# Patient Record
Sex: Male | Born: 1985 | Race: Black or African American | Hispanic: No | Marital: Single | Smoking: Current every day smoker
Health system: Southern US, Community
[De-identification: ages and names within clinical notes are randomized; demographics above are authoritative.]

## PROBLEM LIST (undated history)

## (undated) DIAGNOSIS — F32A Depression, unspecified: Secondary | ICD-10-CM

## (undated) DIAGNOSIS — F329 Major depressive disorder, single episode, unspecified: Secondary | ICD-10-CM

## (undated) DIAGNOSIS — J45909 Unspecified asthma, uncomplicated: Secondary | ICD-10-CM

## (undated) DIAGNOSIS — G4733 Obstructive sleep apnea (adult) (pediatric): Secondary | ICD-10-CM

## (undated) HISTORY — PX: TYMPANOSTOMY TUBE PLACEMENT: SHX32

## (undated) HISTORY — DX: Obstructive sleep apnea (adult) (pediatric): G47.33

## (undated) HISTORY — PX: WISDOM TOOTH EXTRACTION: SHX21

---
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
## 2004-11-10 ENCOUNTER — Ambulatory Visit (HOSPITAL_BASED_OUTPATIENT_CLINIC_OR_DEPARTMENT_OTHER): Admission: RE | Admit: 2004-11-10 | Discharge: 2004-11-10 | Payer: Self-pay | Admitting: Pediatrics

## 2004-11-15 ENCOUNTER — Ambulatory Visit: Payer: Self-pay | Admitting: Internal Medicine

## 2005-01-07 ENCOUNTER — Emergency Department (HOSPITAL_COMMUNITY): Admission: EM | Admit: 2005-01-07 | Discharge: 2005-01-07 | Payer: Self-pay | Admitting: Emergency Medicine

## 2005-09-22 ENCOUNTER — Emergency Department (HOSPITAL_COMMUNITY): Admission: EM | Admit: 2005-09-22 | Discharge: 2005-09-22 | Payer: Self-pay | Admitting: Family Medicine

## 2009-09-03 ENCOUNTER — Emergency Department (HOSPITAL_COMMUNITY): Admission: EM | Admit: 2009-09-03 | Discharge: 2009-09-03 | Payer: Self-pay | Admitting: Emergency Medicine

## 2010-12-11 NOTE — Procedures (Signed)
[name], Leroy Lindsey               ACCOUNT NO.:  [id number]   MEDICAL RECORD NO.:  [id number]          PATIENT TYPE:  OUT   LOCATION:  SLEEP CENTER                 FACILITY:  [facility] ([facility]   PHYSICIAN:  Clinton D. Maple Hudson, M.D. DATE OF BIRTH:  20-Jul-1986   DATE OF STUDY:  11/10/2004                              NOCTURNAL POLYSOMNOGRAM   STUDY DATE:  November 10, 2004   REFERRING PHYSICIAN:  Dr. Maryellen Pile   INDICATION FOR STUDY:  Insomnia with sleep apnea.  Other sleep disturbance  somnambulism.  Epworth Sleepiness Score 14/24, BMI 27, weight 184 pounds.   SLEEP ARCHITECTURE:  Total sleep time 448 minutes with sleep efficiency 87%.  Stage I was 3%, stage II 68%, stages III and IV 10%, REM was 19% of total  sleep time.  Sleep latency 46 minutes, REM latency 68 minutes, awake after  sleep onset 22 minutes, arousal index 4.8.  No sleep medications were taken.  Sleep onset was at 10:08 p.m.   RESPIRATORY DATA:  Respiratory disturbance index (RDI, AHI) 0 per hour.  There were no scoreable episodes of sleep-disordered breathing.   OXYGEN DATA:  Mild snoring noted occasionally.  Oxygen desaturation to a  nadir of 90% with mean oxygen saturation on room air 98%.   CARDIAC DATA:  Normal sinus rhythm and sinus bradycardia, heart rate 52-67  beats per minute.   MOVEMENT/PARASOMNIA:  Occasional leg jerk with insignificant affect on  sleep.   IMPRESSION/RECOMMENDATION:  1.  No sleep-disordered breathing, with only mild snoring and normal      oxygenation.  This is within normal limits.  2.  History suggests sleep schedule disturbance likely to reflect      depression, poor sleep hygiene, and possibly a desire to use sleep to      avoid scheduled daytime activities.  Accuracy and appropriateness of      this evaluation can best be determined by the primary physician.     CDY/MEDQ  D:  11/15/2004 10:40:40  T:  11/15/2004 11:23:19  Job:  [id number]

## 2014-02-20 ENCOUNTER — Emergency Department (HOSPITAL_COMMUNITY)
Admission: EM | Admit: 2014-02-20 | Discharge: 2014-02-20 | Disposition: A | Discharge status: Home / Self Care | Payer: Self-pay | Attending: Emergency Medicine | Admitting: Emergency Medicine

## 2014-02-20 ENCOUNTER — Encounter (HOSPITAL_COMMUNITY): Payer: Self-pay | Admitting: Emergency Medicine

## 2014-02-20 DIAGNOSIS — Z23 Encounter for immunization: Secondary | ICD-10-CM | POA: Insufficient documentation

## 2014-02-20 DIAGNOSIS — S61409A Unspecified open wound of unspecified hand, initial encounter: Secondary | ICD-10-CM | POA: Insufficient documentation

## 2014-02-20 DIAGNOSIS — W261XXA Contact with sword or dagger, initial encounter: Secondary | ICD-10-CM

## 2014-02-20 DIAGNOSIS — Y929 Unspecified place or not applicable: Secondary | ICD-10-CM | POA: Insufficient documentation

## 2014-02-20 DIAGNOSIS — S61412A Laceration without foreign body of left hand, initial encounter: Secondary | ICD-10-CM

## 2014-02-20 DIAGNOSIS — W260XXA Contact with knife, initial encounter: Secondary | ICD-10-CM | POA: Insufficient documentation

## 2014-02-20 DIAGNOSIS — Y9389 Activity, other specified: Secondary | ICD-10-CM | POA: Insufficient documentation

## 2014-02-20 MED ORDER — TETANUS-DIPHTH-ACELL PERTUSSIS 5-2.5-18.5 LF-MCG/0.5 IM SUSP
0.5000 mL | Freq: Once | INTRAMUSCULAR | Status: AC
  Administered 2014-02-20: 0.5 mL via INTRAMUSCULAR
  Filled 2014-02-20: qty 0.5

## 2014-02-20 NOTE — ED Notes (Signed)
Pt reports he cut himself this morning between his left thumb and pointer finger. Pain 5/10 at present. Bleeding controlled.

## 2014-02-20 NOTE — Discharge Instructions (Signed)

## 2014-02-20 NOTE — ED Provider Notes (Signed)
CSN: [id number]     Arrival date & time 02/20/14  1717 History  This chart was scribed for non-physician practitioner, Coral CeoJessica Trinnity Breunig, PA-C working with Leroy RazorStephen Kohut, MD by Phillis HaggisGabriella Gaje, ED scribe. This patient was seen in room WTR6/WTR6 and the patient's care was started at 6:57 PM.   Chief Complaint  Patient presents with  . Extremity Laceration   The history is provided by the patient. No language interpreter was used.   HPI Comments: Leroy Lindsey is a 28 y.o. male with no PMH who presents to the Emergency Department complaining of a laceration between his left thumb and index finger that occurred earlier this morning. He states that he was cutting ice with a knife, chipping away at it when the knife slipped and he cut the area between his left thumb and ring finger. He states that he put peroxide on the area to clean it. He denies the knife breaking at any point and denies any foreign bodies. He reports associated pain and stiffness with movement. No weakness, loss of sensation, numbness/tingling. He states that he is not UTD on his tdap. He reports that it took about 2 hours to control bleeding with direct pressure.    History reviewed. No pertinent past medical history. History reviewed. No pertinent past surgical history. History reviewed. No pertinent family history. History  Substance Use Topics  . Smoking status: Never Smoker   . Smokeless tobacco: Not on file  . Alcohol Use: No    Review of Systems  Constitutional: Negative for fever, chills, activity change, appetite change and fatigue.  Musculoskeletal: Negative for arthralgias, joint swelling and myalgias.  Skin: Positive for wound.  Neurological: Negative for weakness and numbness.  All other systems reviewed and are negative.   Allergies  Review of patient's allergies indicates no known allergies.  Home Medications   Prior to Admission medications   Not on File   BP 128/83  Pulse 80  Temp(Src) 98.1 F (36.7  C) (Oral)  Resp 16  SpO2 99%  Filed Vitals:   02/20/14 1723 02/20/14 1918  BP: 128/83 121/84  Pulse: 80 78  Temp: 98.1 F (36.7 C) 97.7 F (36.5 C)  TempSrc: Oral Oral  Resp: 16 18  SpO2: 99% 98%    Physical Exam  Nursing note and vitals reviewed. Constitutional: He is oriented to person, place, and time. He appears well-developed and well-nourished. No distress.  HENT:  Head: Normocephalic and atraumatic.  Right Ear: External ear normal.  Left Ear: External ear normal.  Nose: Nose normal.  Mouth/Throat: Oropharynx is clear and moist.  Eyes: Conjunctivae and EOM are normal.  Neck: Neck supple.  Cardiovascular: Normal rate.   Radial pulses present on the left. Capillary refill <2 seconds on digits of left hand.   Pulmonary/Chest: Effort normal. No respiratory distress.  Musculoskeletal: Normal range of motion. He exhibits no edema and no tenderness.       Hands: ROM intact in the digits of the left hand without limitations. Grip strength 5/5 on the left   Neurological: He is alert and oriented to person, place, and time.  Gross sensation intact in the left hand  Skin: Skin is warm and dry. He is not diaphoretic.  1 cm linear laceration to the webspace between the 1st and 2nd digits of the left hand. No palpable foreign bodies. Bleeding controlled.   Psychiatric: He has a normal mood and affect. His behavior is normal.    ED Course  Procedures (including critical care  time)  DIAGNOSTIC STUDIES: Oxygen Saturation is 99% on RA, normal by my interpretation.    COORDINATION OF CARE: 6:59 PM-Discussed treatment plan which includes laceration repair and tdap with pt at bedside and pt agreed to plan.   LACERATION REPAIR PROCEDURE NOTE The patient's identification was confirmed and consent was obtained. This procedure was performed by Coral Ceo, PA-C, at 7:01 PM. Site: webbing between left thumb and ring finger Sterile procedures observed Anesthetic used (type and  amt): 6 mL 1% lidocaine without epinephrine Suture type/size: 5.0 Ethilon  Length: 1 cm # of Sutures: 2 Technique: single interrupted.  Complexity: simple Tetanus ordered Site anesthetized, irrigated with NS, explored without evidence of foreign body, wound well approximated, site covered with dry, sterile dressing.  Patient tolerated procedure well without complications. Instructions for care discussed verbally and patient provided with additional written instructions for homecare and f/u.  Labs Review Labs Reviewed - No data to display  Imaging Review No results found.   EKG Interpretation None       MDM   Leroy Lindsey is a 28 y.o. male with no PMH who presents to the Emergency Department complaining of a laceration between his left thumb and index finger that occurred earlier this morning. Laceration repaired in the ED. Patient neurovascularly intact. Tetanus booster given in the ED. Laceration care discussed. Return for suture removal. Return precautions, discharge instructions, and follow-up was discussed with the patient before discharge.     There are no discharge medications for this patient.   Final impressions: 1. Laceration of left hand, initial encounter       Luiz Iron PA-C   I personally performed the services described in this documentation, which was scribed in my presence. The recorded information has been reviewed and is accurate.  Jillyn Ledger, PA-C 02/21/14 [phone]

## 2014-02-22 NOTE — ED Provider Notes (Signed)
Medical screening examination/treatment/procedure(s) were performed by non-physician practitioner and as supervising physician I was immediately available for consultation/collaboration.   EKG Interpretation None       Raeford RazorStephen Napolean Sia, MD 02/22/14 2211

## 2016-05-31 ENCOUNTER — Emergency Department (HOSPITAL_COMMUNITY): Payer: Self-pay

## 2016-05-31 ENCOUNTER — Emergency Department (HOSPITAL_COMMUNITY)
Admission: EM | Admit: 2016-05-31 | Discharge: 2016-05-31 | Disposition: A | Discharge status: Home / Self Care | Payer: Self-pay | Attending: Physician Assistant | Admitting: Physician Assistant

## 2016-05-31 ENCOUNTER — Encounter (HOSPITAL_COMMUNITY): Payer: Self-pay | Admitting: Emergency Medicine

## 2016-05-31 DIAGNOSIS — J40 Bronchitis, not specified as acute or chronic: Secondary | ICD-10-CM | POA: Insufficient documentation

## 2016-05-31 DIAGNOSIS — F17200 Nicotine dependence, unspecified, uncomplicated: Secondary | ICD-10-CM | POA: Insufficient documentation

## 2016-05-31 HISTORY — DX: Unspecified asthma, uncomplicated: J45.909

## 2016-05-31 MED ORDER — PSEUDOEPH-BROMPHEN-DM 15-1-5 MG/5ML PO ELIX: 20.0000 mL | ORAL_SOLUTION | Freq: Four times a day (QID) | ORAL | 0 refills | Status: DC | PRN

## 2016-05-31 NOTE — ED Triage Notes (Signed)
Pt reports productive cough and occasional chills x 4 weeks.Productive cough and decreased breath sounds noteed. No audible wheezing Pt treat red with OTC meds. Pt stated that he has seasonal allergies with a hx of wheezing. Pt is alert, oriented and cooperative.Mother at bedside

## 2016-05-31 NOTE — ED Triage Notes (Signed)
Pt denies sore throat, c/o some itching in throat

## 2016-05-31 NOTE — ED Provider Notes (Signed)
[facility]-EMERGENCY DEPT Provider Note   CSN: [id number] Arrival date & time: 05/31/16  1052  By signing my name below, I, Placido SouLogan Joldersma, attest that this documentation has been prepared under the direction and in the presence of Felicie Mornavid Eleno Weimar, NP. Electronically Signed: Placido SouLogan Joldersma, ED Scribe. 05/31/16. 1:16 PM.   History   Chief Complaint Chief Complaint  Patient presents with  . Cough    x 4 weeks  . Headache    intermittent  . Chills    HPI HPI Comments: Leroy Lindsey is a 30 y.o. male with a h/o asthma who presents to the Emergency Department complaining of constant, moderate, productive cough with white/yellow sputum x 4 weeks. He reports associated rhinorrhea, HA, intermittent post tussive emesis and intermittent chills. He has taken OTC cold medication w/o significant relief. He is a smoker. No nausea, abdominal pain or other associated symptoms at this time.   PCP: None   The history is provided by the patient. No language interpreter was used.    Past Medical History:  Diagnosis Date  . Asthma    seasonal    There are no active problems to display for this patient.   Past Surgical History:  Procedure Laterality Date  . TYMPANOSTOMY TUBE PLACEMENT    . WISDOM TOOTH EXTRACTION         Home Medications    Prior to Admission medications   Not on File    Family History Family History  Problem Relation Age of Onset  . Hypertension Mother     Social History Social History  Substance Use Topics  . Smoking status: Current Some Day Smoker  . Smokeless tobacco: Never Used  . Alcohol use No     Allergies   Patient has no known allergies.   Review of Systems Review of Systems  Constitutional: Positive for chills. Negative for fever.  HENT: Positive for congestion, rhinorrhea and sneezing.   Respiratory: Positive for cough.   Gastrointestinal: Positive for vomiting. Negative for abdominal pain and nausea.  All other systems reviewed and are  negative.  Physical Exam Updated Vital Signs BP 147/97 (BP Location: Left Arm)   Pulse 108   Temp 98.4 F (36.9 C) (Oral)   Resp 22   SpO2 99%   Physical Exam  Constitutional: He is oriented to person, place, and time. He appears well-developed and well-nourished.  HENT:  Head: Normocephalic and atraumatic.  Mouth/Throat: Posterior oropharyngeal erythema present. No oropharyngeal exudate.  Eyes: EOM are normal.  Neck: Normal range of motion.  Cardiovascular: Normal rate.   Pulmonary/Chest: Effort normal. No respiratory distress. He has no wheezes.  Mild rhonchi noted bilaterally. No active wheezing.   Abdominal: Soft.  Musculoskeletal: Normal range of motion.  Neurological: He is alert and oriented to person, place, and time.  Skin: Skin is warm and dry.  Psychiatric: He has a normal mood and affect.  Nursing note and vitals reviewed.  ED Treatments / Results  Labs (all labs ordered are listed, but only abnormal results are displayed) Labs Reviewed - No data to display  EKG  EKG Interpretation None       Radiology Dg Chest 2 View  Result Date: 05/31/2016 CLINICAL DATA:  Mid chest pain and shortness of breath with productive cough for the past 4 weeks. History of asthma, current smoker. EXAM: CHEST  2 VIEW COMPARISON:  PA and lateral chest x-ray of September 03, 2009 FINDINGS: The lungs are reasonably well inflated. The interstitial markings are increased bilaterally. There  is no alveolar infiltrate. There is no pleural effusion. The heart and pulmonary vascularity are normal. The mediastinum is normal in width. The bony thorax exhibits no acute abnormality. IMPRESSION: Interstitial prominence bilaterally which is more conspicuous than on the previous study. This is consistent with known reactive airway disease but there may be superimposed acute bronchitis. Electronically Signed   By: Marcianne Ozbun  [place] M.D.   On: 05/31/2016 12:59    Procedures Procedures  DIAGNOSTIC  STUDIES: Oxygen Saturation is 99% on RA, normal by my interpretation.    COORDINATION OF CARE: 1:16 PM Discussed next steps with pt. Pt verbalized understanding and is agreeable with the plan.    Medications Ordered in ED Medications - No data to display   Initial Impression / Assessment and Plan / ED Course  I have reviewed the triage vital signs and the nursing notes.  Pertinent labs & imaging results that were available during my care of the patient were reviewed by me and considered in my medical decision making (see chart for details).  Clinical Course     Patient with mild signs and symptoms of asthma/RAD. Oxygen saturation is above 90%. No accessory muscle use, no cyanosis. No wheezing. Will discharge with anti-tussive/anti-histamine. Pt instructed to follow up with PCP. Patient is hemodynamically stable. Discussed return precautions. Pt appears safe for discharge.     Final Clinical Impressions(s) / ED Diagnoses   Final diagnoses:  Bronchitis    New Prescriptions Discharge Medication List as of 05/31/2016  1:46 PM    START taking these medications   Details  brompheniramine-pseudoephedrine-dextromethorphan (DIMETAPP DM) 15-1-5 MG/5ML ELIX Take 20 mLs by mouth every 6 (six) hours as needed., Starting Mon 05/31/2016, Print         Felicie Mornavid Joann Jorge, NP 05/31/16 1459    Courteney Lyn Mackuen, MD 06/02/16 0900

## 2016-05-31 NOTE — ED Notes (Signed)
Bed: WTR6 Expected date:  Expected time:  Means of arrival:  Comments: 

## 2017-02-25 ENCOUNTER — Emergency Department (HOSPITAL_COMMUNITY)
Admission: EM | Admit: 2017-02-25 | Discharge: 2017-02-25 | Disposition: A | Discharge status: Home / Self Care | Payer: Self-pay | Attending: Emergency Medicine | Admitting: Emergency Medicine

## 2017-02-25 ENCOUNTER — Emergency Department (HOSPITAL_COMMUNITY): Payer: Self-pay

## 2017-02-25 ENCOUNTER — Encounter (HOSPITAL_COMMUNITY): Payer: Self-pay

## 2017-02-25 DIAGNOSIS — J45909 Unspecified asthma, uncomplicated: Secondary | ICD-10-CM | POA: Insufficient documentation

## 2017-02-25 DIAGNOSIS — Y939 Activity, unspecified: Secondary | ICD-10-CM | POA: Insufficient documentation

## 2017-02-25 DIAGNOSIS — S52501A Unspecified fracture of the lower end of right radius, initial encounter for closed fracture: Secondary | ICD-10-CM | POA: Insufficient documentation

## 2017-02-25 DIAGNOSIS — W010XXA Fall on same level from slipping, tripping and stumbling without subsequent striking against object, initial encounter: Secondary | ICD-10-CM | POA: Insufficient documentation

## 2017-02-25 DIAGNOSIS — F17210 Nicotine dependence, cigarettes, uncomplicated: Secondary | ICD-10-CM | POA: Insufficient documentation

## 2017-02-25 DIAGNOSIS — Y999 Unspecified external cause status: Secondary | ICD-10-CM | POA: Insufficient documentation

## 2017-02-25 DIAGNOSIS — Y929 Unspecified place or not applicable: Secondary | ICD-10-CM | POA: Insufficient documentation

## 2017-02-25 MED ORDER — IBUPROFEN 600 MG PO TABS: 600.0000 mg | ORAL_TABLET | Freq: Four times a day (QID) | ORAL | 0 refills | Status: DC | PRN

## 2017-02-25 NOTE — ED Provider Notes (Signed)
[facility]-EMERG[facility]Y DEPT Provider Note   CSN: [id number] Arrival date & time: 02/25/17  1040     History   Chief Complaint Chief Complaint  Patient presents with  . Wrist Pain  . Fall    HPI Leroy LeiterSteven Barrasso is a 31 y.o. male.  The history is provided by the patient and medical records. No language interpreter was used.  Wrist Pain   Fall    Leroy Lindsey is a 31 y.o. male  with a PMH of asthma who presents to the Emergency Department complaining of Acute onset of right wrist pain which began after falling on outstretched hand yesterday afternoon. Patient states that he slipped on the wet ground, falling forward catching himself with the right hand. Denies any other injuries from the fall. No head injury or loss of consciousness. Not on anticoagulation. Has been applying ice with moderate relief. Associated symptoms include swelling. No numbness or tingling. No weakness. No history of prior injuries to the right upper extremity.  Past Medical History:  Diagnosis Date  . Asthma    seasonal    There are no active problems to display for this patient.   Past Surgical History:  Procedure Laterality Date  . TYMPANOSTOMY TUBE PLACEMENT    . WISDOM TOOTH EXTRACTION         Home Medications    Prior to Admission medications   Medication Sig Start Date End Date Taking? Authorizing Provider  brompheniramine-pseudoephedrine-dextromethorphan (DIMETAPP DM) 15-1-5 MG/5ML ELIX Take 20 mLs by mouth every 6 (six) hours as needed. 05/31/16   Felicie MornSmith, David, NP  ibuprofen (ADVIL,MOTRIN) 600 MG tablet Take 1 tablet (600 mg total) by mouth every 6 (six) hours as needed. 02/25/17   Ward, Chase PicketJaime Pilcher, PA-C    Family History Family History  Problem Relation Age of Onset  . Hypertension Mother     Social History Social History  Substance Use Topics  . Smoking status: Current Some Day Smoker    Types: Cigarettes  . Smokeless tobacco: Never Used  . Alcohol use No     Allergies     Patient has no known allergies.   Review of Systems Review of Systems  Musculoskeletal: Positive for arthralgias.  Neurological: Negative for weakness and numbness.     Physical Exam Updated Vital Signs BP (!) 144/96 (BP Location: Left Arm)   Pulse 88   Temp 98 F (36.7 C)   Resp 16   Ht 5\' 10"  (1.778 m)   Wt 105.2 kg (232 lb)   SpO2 95%   BMI 33.29 kg/m   Physical Exam  Constitutional: He appears well-developed and well-nourished. No distress.  HENT:  Head: Normocephalic and atraumatic.  Neck: Neck supple.  Cardiovascular: Normal rate, regular rhythm and normal heart sounds.   No murmur heard. Pulmonary/Chest: Effort normal and breath sounds normal. No respiratory distress. He has no wheezes. He has no rales.  Musculoskeletal:  Tenderness to palpation of radial aspect of the wrist associated with swelling. Good grip strength. All compartments soft. Sensation intact to radial, ulnar and median nerve distributions. Good cap refill in all fingers.  Neurological: He is alert.  Skin: Skin is warm and dry.  Nursing note and vitals reviewed.    ED Treatments / Results  Labs (all labs ordered are listed, but only abnormal results are displayed) Labs Reviewed - No data to display  EKG  EKG Interpretation None       Radiology Dg Wrist Complete Right  Result Date: 02/25/2017 CLINICAL DATA:  Pain following fall EXAM: RIGHT WRIST - COMPLETE 3+ VIEW COMPARISON:  None. FINDINGS: Frontal, oblique, lateral, and ulnar deviation scaphoid images were obtained. There is a rather subtle nondisplaced fracture of the distal radial metaphysis, best appreciated on the lateral view. No other evident fracture. No dislocation. Joint spaces appear normal. No erosive change. IMPRESSION: Subtle nondisplaced fracture distal radial metaphysis, best appreciated on the lateral view. No other fracture. No dislocation. No appreciable arthropathy. Electronically Signed   By: Bretta BangWilliam  Woodruff III  M.D.   On: 02/25/2017 11:44    Procedures Procedures (including critical care time)  SPLINT APPLICATION Date/Time: 1:24 PM Authorized by: Chase PicketJaime Pilcher Ward Consent: Verbal consent obtained. Risks and benefits: risks, benefits and alternatives were discussed Consent given by: patient Splint applied by: orthopedic technician Location details: Right upper extremity Splint type: Sugar tong Post-procedure: The splinted body part was neurovascularly unchanged following the procedure. Patient tolerance: Patient tolerated the procedure well with no immediate complications.    Medications Ordered in ED Medications - No data to display   Initial Impression / Assessment and Plan / ED Course  I have reviewed the triage vital signs and the nursing notes.  Pertinent labs & imaging results that were available during my care of the patient were reviewed by me and considered in my medical decision making (see chart for details).     Leroy LeiterSteven Osgood is a 31 y.o. male who presents to ED for right wrist pain s/p mechanical fall. NVI. Tenderness and swelling on exam. X-ray shows subtle nondisplaced distal radial fracture. Splint placed. Follow up with ortho. Home care and return precautions discussed. All questions answered.    Final Clinical Impressions(s) / ED Diagnoses   Final diagnoses:  Closed fracture of distal end of right radius, unspecified fracture morphology, initial encounter    New Prescriptions New Prescriptions   IBUPROFEN (ADVIL,MOTRIN) 600 MG TABLET    Take 1 tablet (600 mg total) by mouth every 6 (six) hours as needed.     Ward, Chase PicketJaime Pilcher, PA-C 02/25/17 1324    Derwood KaplanNanavati, Ankit, MD 02/25/17 1757

## 2017-02-25 NOTE — ED Notes (Signed)
PATIENT FELL 02/24/17  NOW HAS RIGHT FOREARM PAIN  HAS GOOD PMS  SOME SWELLING PATIENT NEGATIVE FOR DEFORMITIES

## 2017-02-25 NOTE — ED Notes (Signed)
Bed: WA05 Expected date:  Expected time:  Means of arrival:  Comments: 

## 2017-02-25 NOTE — ED Notes (Signed)
Ortho tech notified of splint need. 

## 2017-02-25 NOTE — ED Notes (Signed)
Ortho tech busy assisting another pt, PA and Pt made aware of the delay

## 2017-02-25 NOTE — ED Triage Notes (Addendum)
Patient states he slipped on the wet ground this AM and landed on his right hand. Patient c/o pain of the right wrist. Patient denies hitting his head. No deformity noted.

## 2017-02-25 NOTE — Discharge Instructions (Signed)
Ibuprofen as needed for pain. Ice affected area (see instructions below).  Please call the orthopedic physician listed today or first thing in the morning to schedule a follow up appointment.   Fractures generally take 4-6 weeks to heal.  It is very important to keep your splint dry until your follow up with the orthopedic doctor and a cast can be applied. You may place a plastic bag around the extremity with the splint while bathing to keep it dry. Also try to sleep with the extremity elevated for the next several nights to decrease swelling. Check the fingertips several times per day to make sure they are not cold, pale, or blue. If this is the case, the splint may be too tight and should return to the ER, your regular doctor or the orthopedist for recheck. Return to the ER for new or worsening symptoms, any additional concerns.   COLD THERAPY DIRECTIONS:  Ice or gel packs can be used to reduce both pain and swelling. Ice is the most helpful within the first 24 to 48 hours after an injury or flareup from overusing a muscle or joint.  Ice is effective, has very few side effects, and is safe for most people to use.   If you expose your skin to cold temperatures for too long or without the proper protection, you can damage your skin or nerves. Watch for signs of skin damage due to cold.   HOME CARE INSTRUCTIONS  Follow these tips to use ice and cold packs safely.  Place a dry or damp towel between the ice and skin. A damp towel will cool the skin more quickly, so you may need to shorten the time that the ice is used.  For a more rapid response, add gentle compression to the ice.  Ice for no more than 10 to 20 minutes at a time. The bonier the area you are icing, the less time it will take to get the benefits of ice.  Check your skin after 5 minutes to make sure there are no signs of a poor response to cold or skin damage.  Rest 20 minutes or more in between uses.  Once your skin is numb, you can end  your treatment. You can test numbness by very lightly touching your skin. The touch should be so light that you do not see the skin dimple from the pressure of your fingertip. When using ice, most people will feel these normal sensations in this order: cold, burning, aching, and numbness.

## 2018-04-04 ENCOUNTER — Emergency Department (HOSPITAL_COMMUNITY): Payer: Self-pay

## 2018-04-04 ENCOUNTER — Observation Stay (HOSPITAL_COMMUNITY)
Admission: EM | Admit: 2018-04-04 | Discharge: 2018-04-05 | Disposition: A | Discharge status: Home / Self Care | Payer: Self-pay | Attending: Internal Medicine | Admitting: Internal Medicine

## 2018-04-04 ENCOUNTER — Encounter (HOSPITAL_COMMUNITY): Payer: Self-pay | Admitting: Emergency Medicine

## 2018-04-04 ENCOUNTER — Other Ambulatory Visit: Payer: Self-pay

## 2018-04-04 DIAGNOSIS — F17210 Nicotine dependence, cigarettes, uncomplicated: Secondary | ICD-10-CM | POA: Insufficient documentation

## 2018-04-04 DIAGNOSIS — F329 Major depressive disorder, single episode, unspecified: Secondary | ICD-10-CM | POA: Insufficient documentation

## 2018-04-04 DIAGNOSIS — E872 Acidosis: Secondary | ICD-10-CM | POA: Insufficient documentation

## 2018-04-04 DIAGNOSIS — Z79899 Other long term (current) drug therapy: Secondary | ICD-10-CM | POA: Insufficient documentation

## 2018-04-04 DIAGNOSIS — M6282 Rhabdomyolysis: Secondary | ICD-10-CM | POA: Insufficient documentation

## 2018-04-04 DIAGNOSIS — E860 Dehydration: Secondary | ICD-10-CM | POA: Insufficient documentation

## 2018-04-04 DIAGNOSIS — N179 Acute kidney failure, unspecified: Principal | ICD-10-CM | POA: Insufficient documentation

## 2018-04-04 DIAGNOSIS — Z720 Tobacco use: Secondary | ICD-10-CM | POA: Diagnosis present

## 2018-04-04 DIAGNOSIS — D72829 Elevated white blood cell count, unspecified: Secondary | ICD-10-CM | POA: Diagnosis present

## 2018-04-04 DIAGNOSIS — E876 Hypokalemia: Secondary | ICD-10-CM | POA: Insufficient documentation

## 2018-04-04 DIAGNOSIS — E669 Obesity, unspecified: Secondary | ICD-10-CM | POA: Insufficient documentation

## 2018-04-04 DIAGNOSIS — J45909 Unspecified asthma, uncomplicated: Secondary | ICD-10-CM | POA: Insufficient documentation

## 2018-04-04 DIAGNOSIS — Z6839 Body mass index (BMI) 39.0-39.9, adult: Secondary | ICD-10-CM | POA: Insufficient documentation

## 2018-04-04 DIAGNOSIS — E8729 Other acidosis: Secondary | ICD-10-CM | POA: Diagnosis present

## 2018-04-04 DIAGNOSIS — R7401 Elevation of levels of liver transaminase levels: Secondary | ICD-10-CM | POA: Diagnosis present

## 2018-04-04 DIAGNOSIS — R740 Nonspecific elevation of levels of transaminase and lactic acid dehydrogenase [LDH]: Secondary | ICD-10-CM | POA: Insufficient documentation

## 2018-04-04 HISTORY — DX: Depression, unspecified: F32.A

## 2018-04-04 HISTORY — DX: Major depressive disorder, single episode, unspecified: F32.9

## 2018-04-04 LAB — CBC WITH DIFFERENTIAL/PLATELET
Basophils Absolute: 0 10*3/uL (ref 0.0–0.1)
Basophils Relative: 0 %
Eosinophils Absolute: 0 10*3/uL (ref 0.0–0.7)
Eosinophils Relative: 0 %
HEMATOCRIT: 43.8 % (ref 39.0–52.0)
HEMOGLOBIN: 14.9 g/dL (ref 13.0–17.0)
LYMPHS ABS: 2.1 10*3/uL (ref 0.7–4.0)
Lymphocytes Relative: 19 %
MCH: 29.7 pg (ref 26.0–34.0)
MCHC: 34 g/dL (ref 30.0–36.0)
MCV: 87.3 fL (ref 78.0–100.0)
MONOS PCT: 4 %
Monocytes Absolute: 0.5 10*3/uL (ref 0.1–1.0)
NEUTROS ABS: 8.4 10*3/uL — AB (ref 1.7–7.7)
NEUTROS PCT: 77 %
Platelets: 252 10*3/uL (ref 150–400)
RBC: 5.02 MIL/uL (ref 4.22–5.81)
RDW: 14 % (ref 11.5–15.5)
WBC: 11 10*3/uL — ABNORMAL HIGH (ref 4.0–10.5)

## 2018-04-04 LAB — COMPREHENSIVE METABOLIC PANEL
ALBUMIN: 5.3 g/dL — AB (ref 3.5–5.0)
ALK PHOS: 75 U/L (ref 38–126)
ALT: 47 U/L — ABNORMAL HIGH (ref 0–44)
ANION GAP: 20 — AB (ref 5–15)
AST: 51 U/L — ABNORMAL HIGH (ref 15–41)
BILIRUBIN TOTAL: 1.2 mg/dL (ref 0.3–1.2)
BUN: 24 mg/dL — ABNORMAL HIGH (ref 6–20)
CALCIUM: 10.1 mg/dL (ref 8.9–10.3)
CO2: 18 mmol/L — AB (ref 22–32)
Chloride: 101 mmol/L (ref 98–111)
Creatinine, Ser: 2.04 mg/dL — ABNORMAL HIGH (ref 0.61–1.24)
GFR calc non Af Amer: 41 mL/min — ABNORMAL LOW (ref >60)
GFR, EST AFRICAN AMERICAN: 48 mL/min — AB (ref >60)
GLUCOSE: 120 mg/dL — AB (ref 70–99)
Potassium: 3.4 mmol/L — ABNORMAL LOW (ref 3.5–5.1)
Sodium: 139 mmol/L (ref 135–145)
Total Protein: 9.2 g/dL — ABNORMAL HIGH (ref 6.5–8.1)

## 2018-04-04 LAB — I-STAT TROPONIN, ED: Troponin i, poc: 0.02 ng/mL (ref 0.00–0.08)

## 2018-04-04 LAB — CK: Total CK: 937 U/L — ABNORMAL HIGH (ref 49–397)

## 2018-04-04 MED ORDER — SODIUM CHLORIDE 0.9 % IV BOLUS
1000.0000 mL | Freq: Once | INTRAVENOUS | Status: AC
Start: 2018-04-04 — End: 2018-04-04
  Administered 2018-04-04: 1000 mL via INTRAVENOUS

## 2018-04-04 MED ORDER — SODIUM CHLORIDE 0.9 % IV BOLUS
1000.0000 mL | Freq: Once | INTRAVENOUS | Status: AC
  Administered 2018-04-04: 1000 mL via INTRAVENOUS

## 2018-04-04 NOTE — ED Provider Notes (Signed)
[facility]-EMERGENCY DEPT Provider Note   CSN: [id number] Arrival date & time: 04/04/18  2046  History   Chief Complaint Chief Complaint  Patient presents with  . Dehydration    HPI Leroy Lindsey is a 32 y.o. male who presents for evaluation of muscle cramping. States he has been outside moving furniture the past two days and noticed lower back cramping and spasms yesterday evening. He awoke today and stated that he began having lower leg cramping and intermittent cramping in his lower chest. Denies aggravating or alleviating factors. Rates his cramping a 7/10. Denies radiation of pain. Denies headache, blurred vision, SOB, syncope, abdominal pain, nausea, vomiting, diarrhea, constipation. States he took a muscle relaxer yesterday evening which "help some" with the pain.  HPI  Past Medical History:  Diagnosis Date  . Asthma    seasonal    There are no active problems to display for this patient.   Past Surgical History:  Procedure Laterality Date  . TYMPANOSTOMY TUBE PLACEMENT    . WISDOM TOOTH EXTRACTION          Home Medications    Prior to Admission medications   Medication Sig Start Date End Date Taking? Authorizing Provider  escitalopram (LEXAPRO) 10 MG tablet Take 10 mg by mouth daily.   Yes [provider]  methocarbamol (ROBAXIN) 750 MG tablet Take 750 mg by mouth daily as needed for muscle spasms.   Yes [provider]  brompheniramine-pseudoephedrine-dextromethorphan (DIMETAPP DM) 15-1-5 MG/5ML ELIX Take 20 mLs by mouth every 6 (six) hours as needed. Patient not taking: Reported on 04/04/2018 05/31/16   Felicie Morn, NP  ibuprofen (ADVIL,MOTRIN) 600 MG tablet Take 1 tablet (600 mg total) by mouth every 6 (six) hours as needed. Patient not taking: Reported on 04/04/2018 02/25/17   Ward, Chase Picket, PA-C    Family History Family History  Problem Relation Age of Onset  . Hypertension Mother     Social History Social  History   Tobacco Use  . Smoking status: Current Some Day Smoker    Types: Cigarettes  . Smokeless tobacco: Never Used  Substance Use Topics  . Alcohol use: No  . Drug use: No     Allergies   Patient has no known allergies.   Review of Systems Review of Systems  Review of systems negative unless otherwise stated in the HPI. Physical Exam Updated Vital Signs BP (!) 155/92 (BP Location: Right Arm)   Pulse 78   Temp 97.6 F (36.4 C) (Oral)   Resp (!) 21   Ht 5\' 8"  (1.727 m)   Wt 117.9 kg   SpO2 100%   BMI 39.53 kg/m   Physical Exam  Constitutional: He appears well-developed and well-nourished. No distress.  HENT:  Head: Normocephalic and atraumatic.  Mouth/Throat: Oropharynx is clear and moist.  Eyes: Pupils are equal, round, and reactive to light.  Neck: Normal range of motion. Neck supple.  Cardiovascular: Normal rate, regular rhythm, normal heart sounds, intact distal pulses and normal pulses. Exam reveals no friction rub.  No murmur heard. Pulmonary/Chest: Effort normal and breath sounds normal. No stridor. No respiratory distress. He has no wheezes. He has no rales. He exhibits no mass, no tenderness, no laceration, no crepitus, no edema, no deformity, no swelling and no retraction.  Abdominal: Soft. Bowel sounds are normal. He exhibits no distension and no mass. There is no tenderness. There is no rebound and no guarding. No hernia.  Musculoskeletal: Normal range of motion. He exhibits  no edema, tenderness or deformity.  No midline tenderness to palpation.  Full range of motion to bilateral extremities. 5/5 strength.  Neurological: He is alert. No sensory deficit.  Skin: Skin is warm and dry. No rash noted. He is not diaphoretic. No erythema. No pallor.  Psychiatric: He has a normal mood and affect.  Nursing note and vitals reviewed.    ED Treatments / Results  Labs (all labs ordered are listed, but only abnormal results are displayed) Labs Reviewed  CBC  WITH DIFFERENTIAL/PLATELET - Abnormal; Notable for the following components:      Result Value   WBC 11.0 (*)    Neutro Abs 8.4 (*)    All other components within normal limits  COMPREHENSIVE METABOLIC PANEL - Abnormal; Notable for the following components:   Potassium 3.4 (*)    CO2 18 (*)    Glucose, Bld 120 (*)    BUN 24 (*)    Creatinine, Ser 2.04 (*)    Total Protein 9.2 (*)    Albumin 5.3 (*)    AST 51 (*)    ALT 47 (*)    GFR calc non Af Amer 41 (*)    GFR calc Af Amer 48 (*)    Anion gap 20 (*)    All other components within normal limits  CK - Abnormal; Notable for the following components:   Total CK 937 (*)    All other components within normal limits  URINALYSIS, ROUTINE W REFLEX MICROSCOPIC  I-STAT TROPONIN, ED    EKG None  Radiology Dg Chest 2 View  Result Date: 04/04/2018 CLINICAL DATA:  Chest pain. EXAM: CHEST - 2 VIEW COMPARISON:  05/31/2016 FINDINGS: The cardiomediastinal contours are normal. The lungs are clear. Pulmonary vasculature is normal. No consolidation, pleural effusion, or pneumothorax. No acute osseous abnormalities are seen. IMPRESSION: Negative radiographs of the chest. Electronically Signed   By: Narda Rutherford M.D.   On: 04/04/2018 22:07    Procedures Procedures (including critical care time)  Medications Ordered in ED Medications  sodium chloride 0.9 % bolus 1,000 mL (1,000 mLs Intravenous New Bag/Given 04/04/18 2341)  sodium chloride 0.9 % bolus 1,000 mL (0 mLs Intravenous Stopped 04/04/18 2340)     Initial Impression / Assessment and Plan / ED Course  I have reviewed the triage vital signs and the nursing notes as well as past medical history.  Pertinent labs & imaging results that were available during my care of the patient were reviewed by me and considered in my medical decision making (see chart for details).  Patient presents for evaluation of muscle cramps and spasms. Hx of strenuous work outside the last 2 days. Concern  for acute heat injury. Will obtain labs, urine, EKG and start fluids and reevaluate.  CK 937, WBC with leukocytosis at 11.0, creatinine 2.04. Urine pending. Will consult with hospitalists for admission for AKI.   0005: Dr. Katrinka Blazing with hospitalists agree for admission.   Final Clinical Impressions(s) / ED Diagnoses   Final diagnoses:  AKI (acute kidney injury) [facility])    ED Discharge Orders    None       Keliyah Spillman A, PA-C 04/05/18 0014    Bethann Berkshire, MD 04/05/18 1429

## 2018-04-04 NOTE — ED Notes (Signed)
Bed: LG92 Expected date:  Expected time:  Means of arrival:  Comments: Ems, cramping dehydrated

## 2018-04-04 NOTE — ED Triage Notes (Signed)
Pt arriving via [facility] with dehydration and cramping. Pt reports he has been outside all day moving furniture. Pt diaphoretic upon arrival.

## 2018-04-05 ENCOUNTER — Encounter (HOSPITAL_COMMUNITY): Payer: Self-pay | Admitting: Internal Medicine

## 2018-04-05 DIAGNOSIS — D72829 Elevated white blood cell count, unspecified: Secondary | ICD-10-CM | POA: Diagnosis present

## 2018-04-05 DIAGNOSIS — N179 Acute kidney failure, unspecified: Principal | ICD-10-CM

## 2018-04-05 DIAGNOSIS — R7401 Elevation of levels of liver transaminase levels: Secondary | ICD-10-CM | POA: Diagnosis present

## 2018-04-05 DIAGNOSIS — E8729 Other acidosis: Secondary | ICD-10-CM | POA: Diagnosis present

## 2018-04-05 DIAGNOSIS — E876 Hypokalemia: Secondary | ICD-10-CM

## 2018-04-05 DIAGNOSIS — R740 Nonspecific elevation of levels of transaminase and lactic acid dehydrogenase [LDH]: Secondary | ICD-10-CM

## 2018-04-05 DIAGNOSIS — Z720 Tobacco use: Secondary | ICD-10-CM

## 2018-04-05 DIAGNOSIS — E860 Dehydration: Secondary | ICD-10-CM

## 2018-04-05 DIAGNOSIS — E872 Acidosis: Secondary | ICD-10-CM

## 2018-04-05 DIAGNOSIS — E6609 Other obesity due to excess calories: Secondary | ICD-10-CM

## 2018-04-05 DIAGNOSIS — E669 Obesity, unspecified: Secondary | ICD-10-CM | POA: Diagnosis present

## 2018-04-05 DIAGNOSIS — Z6839 Body mass index (BMI) 39.0-39.9, adult: Secondary | ICD-10-CM

## 2018-04-05 LAB — CBC
HCT: 39.2 % (ref 39.0–52.0)
Hemoglobin: 13.2 g/dL (ref 13.0–17.0)
MCH: 29.4 pg (ref 26.0–34.0)
MCHC: 33.7 g/dL (ref 30.0–36.0)
MCV: 87.3 fL (ref 78.0–100.0)
PLATELETS: 244 10*3/uL (ref 150–400)
RBC: 4.49 MIL/uL (ref 4.22–5.81)
RDW: 14.3 % (ref 11.5–15.5)
WBC: 14.3 10*3/uL — ABNORMAL HIGH (ref 4.0–10.5)

## 2018-04-05 LAB — COMPREHENSIVE METABOLIC PANEL
ALT: 38 U/L (ref 0–44)
ANION GAP: 10 (ref 5–15)
AST: 43 U/L — ABNORMAL HIGH (ref 15–41)
Albumin: 4.3 g/dL (ref 3.5–5.0)
Alkaline Phosphatase: 67 U/L (ref 38–126)
BUN: 16 mg/dL (ref 6–20)
CO2: 20 mmol/L — AB (ref 22–32)
Calcium: 8.8 mg/dL — ABNORMAL LOW (ref 8.9–10.3)
Chloride: 108 mmol/L (ref 98–111)
Creatinine, Ser: 0.88 mg/dL (ref 0.61–1.24)
Glucose, Bld: 105 mg/dL — ABNORMAL HIGH (ref 70–99)
POTASSIUM: 4.3 mmol/L (ref 3.5–5.1)
SODIUM: 138 mmol/L (ref 135–145)
Total Bilirubin: 0.9 mg/dL (ref 0.3–1.2)
Total Protein: 7.7 g/dL (ref 6.5–8.1)

## 2018-04-05 LAB — PHOSPHORUS: PHOSPHORUS: 3.9 mg/dL (ref 2.5–4.6)

## 2018-04-05 LAB — URINALYSIS, ROUTINE W REFLEX MICROSCOPIC
BACTERIA UA: NONE SEEN
BILIRUBIN URINE: NEGATIVE
Glucose, UA: NEGATIVE mg/dL
Ketones, ur: 80 mg/dL — AB
Leukocytes, UA: NEGATIVE
Nitrite: NEGATIVE
PROTEIN: 30 mg/dL — AB
SPECIFIC GRAVITY, URINE: 1.018 (ref 1.005–1.030)
pH: 5 (ref 5.0–8.0)

## 2018-04-05 LAB — HIV ANTIBODY (ROUTINE TESTING W REFLEX): HIV SCREEN 4TH GENERATION: NONREACTIVE

## 2018-04-05 LAB — MAGNESIUM: MAGNESIUM: 2.3 mg/dL (ref 1.7–2.4)

## 2018-04-05 LAB — CK: CK TOTAL: 1697 U/L — AB (ref 49–397)

## 2018-04-05 MED ORDER — ENOXAPARIN SODIUM 40 MG/0.4ML ~~LOC~~ SOLN
40.0000 mg | Freq: Every day | SUBCUTANEOUS | Status: DC
  Administered 2018-04-05: 40 mg via SUBCUTANEOUS
  Filled 2018-04-05: qty 0.4

## 2018-04-05 MED ORDER — ACETAMINOPHEN 650 MG RE SUPP: 650.0000 mg | Freq: Four times a day (QID) | RECTAL | Status: DC | PRN

## 2018-04-05 MED ORDER — ONDANSETRON HCL 4 MG PO TABS: 4.0000 mg | ORAL_TABLET | Freq: Four times a day (QID) | ORAL | Status: DC | PRN

## 2018-04-05 MED ORDER — ONDANSETRON HCL 4 MG/2ML IJ SOLN: 4.0000 mg | Freq: Four times a day (QID) | INTRAMUSCULAR | Status: DC | PRN

## 2018-04-05 MED ORDER — SODIUM CHLORIDE 0.9 % IV SOLN
INTRAVENOUS | Status: DC
  Administered 2018-04-05: 01:00:00 via INTRAVENOUS

## 2018-04-05 MED ORDER — ACETAMINOPHEN 325 MG PO TABS: 650.0000 mg | ORAL_TABLET | Freq: Four times a day (QID) | ORAL | Status: DC | PRN

## 2018-04-05 MED ORDER — ALBUTEROL SULFATE (2.5 MG/3ML) 0.083% IN NEBU: 2.5000 mg | INHALATION_SOLUTION | Freq: Four times a day (QID) | RESPIRATORY_TRACT | Status: DC | PRN

## 2018-04-05 MED ORDER — ESCITALOPRAM OXALATE 10 MG PO TABS
10.0000 mg | ORAL_TABLET | Freq: Every day | ORAL | Status: DC
  Administered 2018-04-05: 10 mg via ORAL
  Filled 2018-04-05: qty 1

## 2018-04-05 MED ORDER — POTASSIUM CHLORIDE CRYS ER 20 MEQ PO TBCR
40.0000 meq | EXTENDED_RELEASE_TABLET | ORAL | Status: AC
  Administered 2018-04-05: 40 meq via ORAL
  Filled 2018-04-05: qty 2

## 2018-04-05 MED ORDER — METHOCARBAMOL 500 MG PO TABS: 750.0000 mg | ORAL_TABLET | Freq: Every day | ORAL | Status: DC | PRN

## 2018-04-05 NOTE — Discharge Summary (Addendum)
Physician Discharge Summary  Leroy Lindsey HEN:[id number] DOB: 1985-11-10 DOA: 04/04/2018  PCP: Patient, No Pcp Per  Admit date: 04/04/2018 Discharge date: 04/05/2018  Admitted From: home Disposition:  home Recommendations for Outpatient Follow-up:  1. Follow up with community health and wellness  in 1-2 weeks 2. Please obtain CMP/CBC /CPK in one week  Home Health none Equipment/Devices:none  Discharge Condition stable CODE STATUS:stable Diet cardiac  Brief/Interim Summary:32 y.o. male with medical history significant of obesity, depression, and tobacco use; who presents with complaints of muscle cramps.  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  Patient reported trying to keep his self hydrated.  He had began having cramps of his legs, lower chest, and back that he describes as severe.  He had tried taking a muscle relaxer with some temporary relief.  Associated symptoms included generalized malaise, diaphoresis, some difficulty catching his breath, nausea, and vomiting.  Emesis was noted to be nonbloody in appearance.  Denies having any headache, change in vision, focal weakness, loss of consciousness, abdominal pain, dysuria, diarrhea, suicidal ideation, or illicit drug use.  He had been started on Lexapro in July for depression with improvement of symptoms.  Denies having any side effects from taking Lexapro.   ED Course: On admission into the emergency department patient was seen to be afebrile, respiration 21-39, blood pressures 148/99-158/95, and O2 saturation maintained on room air.  Labs revealed WBC 11, potassium 3.2, CO2 18, BUN 24, creatinine 2.04, anion gap 20, AST 51, ALT 47, total protein 9.2, and CK 937.  Chest x-ray was clear for any acute abnormalities.  He had been given 2 L normal saline IV fluids.  Urinalysis not resulted yet.  [facility] called to admit.   Discharge Diagnoses:  Principal Problem:   AKI (acute kidney injury) ([name]) Active Problems:    Dehydration   Leukocytosis   Hypokalemia   Metabolic acidosis, increased anion gap   Transaminitis   Obesity   Tobacco use  1] AKI secondary to dehydration and mild rhabdomyolysis.  Patient was treated with IV fluids.  Creatinine came down to normal level upon discharge.  Remained symptom-free ambulating in hallway.  Patient reports he does not have a primary care physician.  Refer him to community health and wellness. leuko Cytosis improved with IV fluids.  Hypokalemia  resolved.  Potassium 4.3 at the time of discharge.  Annual gap 10.  Creatinine 0.88 sodium 138.  His CPK was 1697.  He will be continued on the IV fluids the whole day and then discharged home later today.  He will follow-up at community health and wellness to recheck his labs and CPK.  Mild transaminitis improved with IV hydration.  2] depression continue Lexapro.  Discharge Instructions  Discharge Instructions    Call MD for:  difficulty breathing, headache or visual disturbances   Complete by:  As directed    Call MD for:  extreme fatigue   Complete by:  As directed    Call MD for:  persistant dizziness or light-headedness   Complete by:  As directed    Call MD for:  persistant nausea and vomiting   Complete by:  As directed    Call MD for:  severe uncontrolled pain   Complete by:  As directed    Diet - low sodium heart healthy   Complete by:  As directed    Increase activity slowly   Complete by:  As directed      Allergies as of  04/05/2018   No Known Allergies     Medication List    STOP taking these medications   brompheniramine-pseudoephedrine-dextromethorphan 15-1-5 MG/5ML Elix Commonly known as:  DIMETAPP DM   ibuprofen 600 MG tablet Commonly known as:  ADVIL,MOTRIN     TAKE these medications   escitalopram 10 MG tablet Commonly known as:  LEXAPRO Take 10 mg by mouth daily.   methocarbamol 750 MG tablet Commonly known as:  ROBAXIN Take 750 mg by mouth daily as needed for muscle spasms.       Follow-up Information    [facility] Follow up.   Contact information: [address] [place] [address] [phone]         No Known Allergies  Consultations:  none   Procedures/Studies: Dg Chest 2 View  Result Date: 04/04/2018 CLINICAL DATA:  Chest pain. EXAM: CHEST - 2 VIEW COMPARISON:  05/31/2016 FINDINGS: The cardiomediastinal contours are normal. The lungs are clear. Pulmonary vasculature is normal. No consolidation, pleural effusion, or pneumothorax. No acute osseous abnormalities are seen. IMPRESSION: Negative radiographs of the chest. Electronically Signed   By: Narda Rutherford M.D.   On: 04/04/2018 22:07    (Echo, Carotid, EGD, Colonoscopy, ERCP)    Subjective:   Discharge Exam: Vitals:   04/05/18 0122 04/05/18 0609  BP: (!) 152/87 (!) 149/83  Pulse: 82 74  Resp: 18 18  Temp: 97.8 F (36.6 C) (!) 97.3 F (36.3 C)  SpO2: 99% 98%   Vitals:   04/04/18 2130 04/04/18 2341 04/05/18 0122 04/05/18 0609  BP: (!) 148/99 (!) 155/92 (!) 152/87 (!) 149/83  Pulse:  78 82 74  Resp: (!) 27 (!) 21 18 18   Temp:   97.8 F (36.6 C) (!) 97.3 F (36.3 C)  TempSrc:   Oral Oral  SpO2:  100% 99% 98%  Weight:  117.9 kg    Height:  5\' 8"  (1.727 m)      General: Pt is alert, awake, not in acute distress Cardiovascular: RRR, S1/S2 +, no rubs, no gallops Respiratory: CTA bilaterally, no wheezing, no rhonchi Abdominal: Soft, NT, ND, bowel sounds + Extremities: no edema, no cyanosis    The results of significant diagnostics from this hospitalization (including imaging, microbiology, ancillary and laboratory) are listed below for reference.     Microbiology: No results found for this or any previous visit (from the past 240 hour(s)).   Labs: BNP (last 3 results) No results for input(s): BNP in the last 8760 hours. Basic Metabolic Panel: Recent Labs  Lab 04/04/18 2115 04/05/18 0451  NA 139 138  K 3.4* 4.3   CL 101 108  CO2 18* 20*  GLUCOSE 120* 105*  BUN 24* 16  CREATININE 2.04* 0.88  CALCIUM 10.1 8.8*  MG  --  2.3  PHOS  --  3.9   Liver Function Tests: Recent Labs  Lab 04/04/18 2115 04/05/18 0451  AST 51* 43*  ALT 47* 38  ALKPHOS 75 67  BILITOT 1.2 0.9  PROT 9.2* 7.7  ALBUMIN 5.3* 4.3   No results for input(s): LIPASE, AMYLASE in the last 168 hours. No results for input(s): AMMONIA in the last 168 hours. CBC: Recent Labs  Lab 04/04/18 2115 04/05/18 0451  WBC 11.0* 14.3*  NEUTROABS 8.4*  --   HGB 14.9 13.2  HCT 43.8 39.2  MCV 87.3 87.3  PLT 252 244   Cardiac Enzymes: Recent Labs  Lab 04/04/18 2115 04/05/18 0451  CKTOTAL 937* 1,697*  BNP: Invalid input(s): POCBNP CBG: No results for input(s): GLUCAP in the last 168 hours. D-Dimer No results for input(s): DDIMER in the last 72 hours. Hgb A1c No results for input(s): HGBA1C in the last 72 hours. Lipid Profile No results for input(s): CHOL, HDL, LDLCALC, TRIG, CHOLHDL, LDLDIRECT in the last 72 hours. Thyroid function studies No results for input(s): TSH, T4TOTAL, T3FREE, THYROIDAB in the last 72 hours.  Invalid input(s): FREET3 Anemia work up No results for input(s): VITAMINB12, FOLATE, FERRITIN, TIBC, IRON, RETICCTPCT in the last 72 hours. Urinalysis    Component Value Date/Time   COLORURINE YELLOW 04/05/2018 0203   APPEARANCEUR HAZY (A) 04/05/2018 0203   LABSPEC 1.018 04/05/2018 0203   PHURINE 5.0 04/05/2018 0203   GLUCOSEU NEGATIVE 04/05/2018 0203   HGBUR SMALL (A) 04/05/2018 0203   BILIRUBINUR NEGATIVE 04/05/2018 0203   KETONESUR 80 (A) 04/05/2018 0203   PROTEINUR 30 (A) 04/05/2018 0203   NITRITE NEGATIVE 04/05/2018 0203   LEUKOCYTESUR NEGATIVE 04/05/2018 0203   Sepsis Labs Invalid input(s): PROCALCITONIN,  WBC,  LACTICIDVEN Microbiology No results found for this or any previous visit (from the past 240 hour(s)).   Time coordinating discharge: Over 36  minutes  SIGNED:   Alwyn Ren, MD  [facility] 04/05/2018, 11:27 AM Pager   If 7PM-7AM, please contact night-coverage [email or web address] Password [email or web address]

## 2018-04-05 NOTE — ED Notes (Signed)
ED TO INPATIENT HANDOFF REPORT  Name/Age/Gender Leroy Lindsey 32 y.o. male  Code Status    Code Status Orders  (From admission, onward)         Start     Ordered   04/05/18 0019  Full code  Continuous     04/05/18 0020        Code Status History    This patient has a current code status but no historical code status.      Home/SNF/Other Home  Chief Complaint body cramps   Level of Care/Admitting Diagnosis ED Disposition    ED Disposition Condition Comment   Admit  Hospital Area: [facility] [[id number]]  Level of Care: Med-Surg [16]  Diagnosis: AKI (acute kidney injury) [facility]) [[id number]]  Admitting Physician: Norval Morton [[id number]]  Attending Physician: Norval Morton [[id number]]  PT Class (Do Not Modify): Observation [104]  PT Acc Code (Do Not Modify): Observation [10022]       Medical History Past Medical History:  Diagnosis Date  . Asthma    seasonal    Allergies No Known Allergies  IV Location/Drains/Wounds Patient Lines/Drains/Airways Status   Active Line/Drains/Airways    Name:   Placement date:   Placement time:   Site:   Days:   Peripheral IV 04/04/18 Left Hand   04/04/18    2045    Hand   1          Labs/Imaging Results for orders placed or performed during the hospital encounter of 04/04/18 (from the past 48 hour(s))  CBC with Differential     Status: Abnormal   Collection Time: 04/04/18  9:15 PM  Result Value Ref Range   WBC 11.0 (H) 4.0 - 10.5 K/uL   RBC 5.02 4.22 - 5.81 MIL/uL   Hemoglobin 14.9 13.0 - 17.0 g/dL   HCT 43.8 39.0 - 52.0 %   MCV 87.3 78.0 - 100.0 fL   MCH 29.7 26.0 - 34.0 pg   MCHC 34.0 30.0 - 36.0 g/dL   RDW 14.0 11.5 - 15.5 %   Platelets 252 150 - 400 K/uL   Neutrophils Relative % 77 %   Neutro Abs 8.4 (H) 1.7 - 7.7 K/uL   Lymphocytes Relative 19 %   Lymphs Abs 2.1 0.7 - 4.0 K/uL   Monocytes Relative 4 %   Monocytes Absolute 0.5 0.1 - 1.0 K/uL   Eosinophils Relative 0 %   Eosinophils  Absolute 0.0 0.0 - 0.7 K/uL   Basophils Relative 0 %   Basophils Absolute 0.0 0.0 - 0.1 K/uL    Comment: Performed at [facility], [place] [address], [place], [place] [address]  Comprehensive metabolic panel     Status: Abnormal   Collection Time: 04/04/18  9:15 PM  Result Value Ref Range   Sodium 139 135 - 145 mmol/L   Potassium 3.4 (L) 3.5 - 5.1 mmol/L   Chloride 101 98 - 111 mmol/L   CO2 18 (L) 22 - 32 mmol/L   Glucose, Bld 120 (H) 70 - 99 mg/dL   BUN 24 (H) 6 - 20 mg/dL   Creatinine, Ser 2.04 (H) 0.61 - 1.24 mg/dL   Calcium 10.1 8.9 - 10.3 mg/dL   Total Protein 9.2 (H) 6.5 - 8.1 g/dL   Albumin 5.3 (H) 3.5 - 5.0 g/dL   AST 51 (H) 15 - 41 U/L   ALT 47 (H) 0 - 44 U/L   Alkaline Phosphatase 75 38 - 126 U/L   Total  Bilirubin 1.2 0.3 - 1.2 mg/dL   GFR calc non Af Amer 41 (L) >60 mL/min   GFR calc Af Amer 48 (L) >60 mL/min    Comment: (NOTE) The eGFR has been calculated using the CKD EPI equation. This calculation has not been validated in all clinical situations. eGFR's persistently <60 mL/min signify possible Chronic Kidney Disease.    Anion gap 20 (H) 5 - 15    Comment: Performed at [facility], [place] [address], [place], [place] [address]  CK     Status: Abnormal   Collection Time: 04/04/18  9:15 PM  Result Value Ref Range   Total CK 937 (H) 49 - 397 U/L    Comment: Performed at [facility], [place] [address], [place], [place] [address]  I-Stat Troponin, ED (not at [facility])     Status: None   Collection Time: 04/04/18 11:08 PM  Result Value Ref Range   Troponin i, poc 0.02 0.00 - 0.08 ng/mL   Comment 3            Comment: Due to the release kinetics of cTnI, a negative result within the first hours of the onset of symptoms does not rule out myocardial infarction with certainty. If myocardial infarction is still suspected, repeat the test at appropriate intervals.    Dg Chest 2 View  Result Date: 04/04/2018 CLINICAL  DATA:  Chest pain. EXAM: CHEST - 2 VIEW COMPARISON:  05/31/2016 FINDINGS: The cardiomediastinal contours are normal. The lungs are clear. Pulmonary vasculature is normal. No consolidation, pleural effusion, or pneumothorax. No acute osseous abnormalities are seen. IMPRESSION: Negative radiographs of the chest. Electronically Signed   By: Keith Rake M.D.   On: 04/04/2018 22:07    Pending Labs Unresulted Labs (From admission, onward)    Start     Ordered   04/05/18 0500  HIV antibody (Routine Testing)  Tomorrow morning,   R     04/05/18 0020   04/05/18 0500  Magnesium  Tomorrow morning,   R     04/05/18 0020   04/05/18 0500  Phosphorus  Tomorrow morning,   R     04/05/18 0020   04/05/18 0500  CBC  Tomorrow morning,   R     04/05/18 0020   04/05/18 0500  CK  Tomorrow morning,   R     04/05/18 0020   04/05/18 0500  Comprehensive metabolic panel  Tomorrow morning,   R     04/05/18 0020   04/04/18 2115  Urinalysis, Routine w reflex microscopic  Once,   R     04/04/18 2115          Vitals/Pain Today's Vitals   04/04/18 2109 04/04/18 2130 04/04/18 2300 04/04/18 2341  BP: (!) 158/95 (!) 148/99  (!) 155/92  Pulse: 90   78  Resp: (!) 39 (!) 27  (!) 21  Temp: 97.6 F (36.4 C)     TempSrc: Oral     SpO2: 99%   100%  Weight:    117.9 kg  Height:    '5\' 8"'$  (1.727 m)  PainSc:   0-No pain 0-No pain    Isolation Precautions No active isolations  Medications Medications  sodium chloride 0.9 % bolus 1,000 mL (1,000 mLs Intravenous New Bag/Given 04/04/18 2341)  potassium chloride SA (K-DUR,KLOR-CON) CR tablet 40 mEq (has no administration in time range)  0.9 %  sodium chloride infusion (has no administration in time range)  escitalopram (LEXAPRO) tablet 10  mg (has no administration in time range)  methocarbamol (ROBAXIN) tablet 750 mg (has no administration in time range)  enoxaparin (LOVENOX) injection 40 mg (has no administration in time range)  acetaminophen (TYLENOL) tablet 650  mg (has no administration in time range)    Or  acetaminophen (TYLENOL) suppository 650 mg (has no administration in time range)  ondansetron (ZOFRAN) tablet 4 mg (has no administration in time range)    Or  ondansetron (ZOFRAN) injection 4 mg (has no administration in time range)  albuterol (PROVENTIL) (2.5 MG/3ML) 0.083% nebulizer solution 2.5 mg (has no administration in time range)  sodium chloride 0.9 % bolus 1,000 mL (0 mLs Intravenous Stopped 04/04/18 2340)    Mobility walks

## 2018-04-05 NOTE — Progress Notes (Signed)
Discharge instructions given to pt and all questions were answered. Pt taken out to lobby and was picked up by his mom.

## 2018-04-05 NOTE — ED Notes (Signed)
ED TO INPATIENT HANDOFF REPORT  Name/Age/Gender Leroy Lindsey 33 y.o. male  Code Status    Code Status Orders  (From admission, onward)         Start     Ordered   04/05/18 0019  Full code  Continuous     04/05/18 0020        Code Status History    This patient has a current code status but no historical code status.      Home/SNF/Other Home  Chief Complaint body cramps   Level of Care/Admitting Diagnosis ED Disposition    ED Disposition Condition Comment   Admit  Hospital Area: [facility] [[id number]]  Level of Care: Med-Surg [16]  Diagnosis: AKI (acute kidney injury) [facility]) [[id number]]  Admitting Physician: Norval Morton [[id number]]  Attending Physician: Norval Morton [[id number]]  PT Class (Do Not Modify): Observation [104]  PT Acc Code (Do Not Modify): Observation [10022]       Medical History Past Medical History:  Diagnosis Date  . Asthma    seasonal    Allergies No Known Allergies  IV Location/Drains/Wounds Patient Lines/Drains/Airways Status   Active Line/Drains/Airways    Name:   Placement date:   Placement time:   Site:   Days:   Peripheral IV 04/04/18 Left Hand   04/04/18    2045    Hand   1          Labs/Imaging Results for orders placed or performed during the hospital encounter of 04/04/18 (from the past 48 hour(s))  CBC with Differential     Status: Abnormal   Collection Time: 04/04/18  9:15 PM  Result Value Ref Range   WBC 11.0 (H) 4.0 - 10.5 K/uL   RBC 5.02 4.22 - 5.81 MIL/uL   Hemoglobin 14.9 13.0 - 17.0 g/dL   HCT 43.8 39.0 - 52.0 %   MCV 87.3 78.0 - 100.0 fL   MCH 29.7 26.0 - 34.0 pg   MCHC 34.0 30.0 - 36.0 g/dL   RDW 14.0 11.5 - 15.5 %   Platelets 252 150 - 400 K/uL   Neutrophils Relative % 77 %   Neutro Abs 8.4 (H) 1.7 - 7.7 K/uL   Lymphocytes Relative 19 %   Lymphs Abs 2.1 0.7 - 4.0 K/uL   Monocytes Relative 4 %   Monocytes Absolute 0.5 0.1 - 1.0 K/uL   Eosinophils Relative 0 %   Eosinophils  Absolute 0.0 0.0 - 0.7 K/uL   Basophils Relative 0 %   Basophils Absolute 0.0 0.0 - 0.1 K/uL    Comment: Performed at [facility], [place] [address], [place], [place] [address]  Comprehensive metabolic panel     Status: Abnormal   Collection Time: 04/04/18  9:15 PM  Result Value Ref Range   Sodium 139 135 - 145 mmol/L   Potassium 3.4 (L) 3.5 - 5.1 mmol/L   Chloride 101 98 - 111 mmol/L   CO2 18 (L) 22 - 32 mmol/L   Glucose, Bld 120 (H) 70 - 99 mg/dL   BUN 24 (H) 6 - 20 mg/dL   Creatinine, Ser 2.04 (H) 0.61 - 1.24 mg/dL   Calcium 10.1 8.9 - 10.3 mg/dL   Total Protein 9.2 (H) 6.5 - 8.1 g/dL   Albumin 5.3 (H) 3.5 - 5.0 g/dL   AST 51 (H) 15 - 41 U/L   ALT 47 (H) 0 - 44 U/L   Alkaline Phosphatase 75 38 - 126 U/L   Total  Bilirubin 1.2 0.3 - 1.2 mg/dL   GFR calc non Af Amer 41 (L) >60 mL/min   GFR calc Af Amer 48 (L) >60 mL/min    Comment: (NOTE) The eGFR has been calculated using the CKD EPI equation. This calculation has not been validated in all clinical situations. eGFR's persistently <60 mL/min signify possible Chronic Kidney Disease.    Anion gap 20 (H) 5 - 15    Comment: Performed at [facility], [place] [address], [place], [place] [address]  CK     Status: Abnormal   Collection Time: 04/04/18  9:15 PM  Result Value Ref Range   Total CK 937 (H) 49 - 397 U/L    Comment: Performed at [facility], [place] [address] [place], [place] [address]  I-Stat Troponin, ED (not at [facility])     Status: None   Collection Time: 04/04/18 11:08 PM  Result Value Ref Range   Troponin i, poc 0.02 0.00 - 0.08 ng/mL   Comment 3            Comment: Due to the release kinetics of cTnI, a negative result within the first hours of the onset of symptoms does not rule out myocardial infarction with certainty. If myocardial infarction is still suspected, repeat the test at appropriate intervals.    Dg Chest 2 View  Result Date: 04/04/2018 CLINICAL  DATA:  Chest pain. EXAM: CHEST - 2 VIEW COMPARISON:  05/31/2016 FINDINGS: The cardiomediastinal contours are normal. The lungs are clear. Pulmonary vasculature is normal. No consolidation, pleural effusion, or pneumothorax. No acute osseous abnormalities are seen. IMPRESSION: Negative radiographs of the chest. Electronically Signed   By: Keith Rake M.D.   On: 04/04/2018 22:07    Pending Labs Unresulted Labs (From admission, onward)    Start     Ordered   04/05/18 0500  HIV antibody (Routine Testing)  Tomorrow morning,   R     04/05/18 0020   04/05/18 0500  Magnesium  Tomorrow morning,   R     04/05/18 0020   04/05/18 0500  Phosphorus  Tomorrow morning,   R     04/05/18 0020   04/05/18 0500  CBC  Tomorrow morning,   R     04/05/18 0020   04/05/18 0500  CK  Tomorrow morning,   R     04/05/18 0020   04/05/18 0500  Comprehensive metabolic panel  Tomorrow morning,   R     04/05/18 0020   04/04/18 2115  Urinalysis, Routine w reflex microscopic  Once,   R     04/04/18 2115          Vitals/Pain Today's Vitals   04/04/18 2109 04/04/18 2130 04/04/18 2300 04/04/18 2341  BP: (!) 158/95 (!) 148/99  (!) 155/92  Pulse: 90   78  Resp: (!) 39 (!) 27  (!) 21  Temp: 97.6 F (36.4 C)     TempSrc: Oral     SpO2: 99%   100%  Weight:    117.9 kg  Height:    5' 8" (1.727 m)  PainSc:   0-No pain 0-No pain    Isolation Precautions No active isolations  Medications Medications  0.9 %  sodium chloride infusion ( Intravenous New Bag/Given 04/05/18 0052)  escitalopram (LEXAPRO) tablet 10 mg (has no administration in time range)  methocarbamol (ROBAXIN) tablet 750 mg (has no administration in time range)  enoxaparin (LOVENOX) injection 40 mg (has no administration in time  range)  acetaminophen (TYLENOL) tablet 650 mg (has no administration in time range)    Or  acetaminophen (TYLENOL) suppository 650 mg (has no administration in time range)  ondansetron (ZOFRAN) tablet 4 mg (has no  administration in time range)    Or  ondansetron (ZOFRAN) injection 4 mg (has no administration in time range)  albuterol (PROVENTIL) (2.5 MG/3ML) 0.083% nebulizer solution 2.5 mg (has no administration in time range)  sodium chloride 0.9 % bolus 1,000 mL (0 mLs Intravenous Stopped 04/04/18 2340)  sodium chloride 0.9 % bolus 1,000 mL (0 mLs Intravenous Stopped 04/05/18 0045)  potassium chloride SA (K-DUR,KLOR-CON) CR tablet 40 mEq (40 mEq Oral Given 04/05/18 0048)    Mobility walks

## 2018-04-05 NOTE — H&P (Signed)
History and Physical    Leroy Lindsey MBW:[id number] DOB: 02-12-1986 DOA: 04/04/2018  Referring MD/NP/PA: Ralph Leyden, PA-C PCP: Patient, No Pcp Per  Patient coming from: via EMS  Chief Complaint: Cramps  I have personally briefly reviewed patient's old medical records in [facility]   HPI: Leroy Lindsey is a 32 y.o. male with medical history significant of obesity, depression, and tobacco use; who presents with complaints of muscle cramps.  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  Patient reported trying to keep his self hydrated.  He had began having cramps of his legs, lower chest, and back that he describes as severe.  He had tried taking a muscle relaxer with some temporary relief.  Associated symptoms included generalized malaise, diaphoresis, some difficulty catching his breath, nausea, and vomiting.  Emesis was noted to be nonbloody in appearance.  Denies having any headache, change in vision, focal weakness, loss of consciousness, abdominal pain, dysuria, diarrhea, suicidal ideation, or illicit drug use.  He had been started on Lexapro in July for depression with improvement of symptoms.  Denies having any side effects from taking Lexapro.   ED Course: On admission into the emergency department patient was seen to be afebrile, respiration 21-39, blood pressures 148/99-158/95, and O2 saturation maintained on room air.  Labs revealed WBC 11, potassium 3.2, CO2 18, BUN 24, creatinine 2.04, anion gap 20, AST 51, ALT 47, total protein 9.2, and CK 937.  Chest x-ray was clear for any acute abnormalities.  He had been given 2 L normal saline IV fluids.  Urinalysis not resulted yet.  TRH called to admit.  Review of Systems  Constitutional: Positive for diaphoresis and malaise/fatigue. Negative for chills and fever.  HENT: Negative for congestion and nosebleeds.   Eyes: Negative for pain and discharge.  Respiratory: Positive for shortness of breath. Negative for  cough.   Cardiovascular: Negative for palpitations and leg swelling.  Gastrointestinal: Positive for nausea and vomiting. Negative for diarrhea.  Genitourinary: Negative for dysuria and hematuria.  Musculoskeletal: Positive for myalgias. Negative for falls.  Skin: Negative for rash.  Neurological: Negative for focal weakness and loss of consciousness.  Psychiatric/Behavioral: Negative for memory loss and substance abuse.    Past Medical History:  Diagnosis Date  . Asthma    seasonal    Past Surgical History:  Procedure Laterality Date  . TYMPANOSTOMY TUBE PLACEMENT    . WISDOM TOOTH EXTRACTION       reports that he has been smoking cigarettes. He has never used smokeless tobacco. He reports that he does not drink alcohol or use drugs.  No Known Allergies  Family History  Problem Relation Age of Onset  . Hypertension Mother     Prior to Admission medications   Medication Sig Start Date End Date Taking? Authorizing Provider  escitalopram (LEXAPRO) 10 MG tablet Take 10 mg by mouth daily.   Yes [provider]  methocarbamol (ROBAXIN) 750 MG tablet Take 750 mg by mouth daily as needed for muscle spasms.   Yes [provider]  brompheniramine-pseudoephedrine-dextromethorphan (DIMETAPP DM) 15-1-5 MG/5ML ELIX Take 20 mLs by mouth every 6 (six) hours as needed. Patient not taking: Reported on 04/04/2018 05/31/16   Felicie Morn, NP  ibuprofen (ADVIL,MOTRIN) 600 MG tablet Take 1 tablet (600 mg total) by mouth every 6 (six) hours as needed. Patient not taking: Reported on 04/04/2018 02/25/17   Ward, Chase Picket, PA-C    Physical Exam:  Constitutional: Obese male in  NAD, calm, comfortable Vitals:   04/04/18 2100 04/04/18 2109 04/04/18 2130 04/04/18 2341  BP:  (!) 158/95 (!) 148/99 (!) 155/92  Pulse:  90  78  Resp:  (!) 39 (!) 27 (!) 21  Temp:  97.6 F (36.4 C)    TempSrc:  Oral    SpO2: 99% 99%  100%  Weight:    117.9 kg  Height:    5\' 8"  (1.727 m)   Eyes:  PERRL, lids and conjunctivae normal ENMT: Mucous membranes are moist. Posterior pharynx clear of any exudate or lesions. Normal dentition.  Neck: normal, supple, no masses, no thyromegaly Respiratory: clear to auscultation bilaterally, no wheezing, no crackles. Normal respiratory effort. No accessory muscle use.  Cardiovascular: Regular rate and rhythm, no murmurs / rubs / gallops. No extremity edema. 2+ pedal pulses. No carotid bruits.  Abdomen: no tenderness, no masses palpated. No hepatosplenomegaly. Bowel sounds positive.  Musculoskeletal: no clubbing / cyanosis. No joint deformity upper and lower extremities. Good ROM, no contractures. Normal muscle tone.  Skin: no rashes, lesions, ulcers. No induration Neurologic: CN 2-12 grossly intact. Sensation intact, DTR normal. Strength 5/5 in all 4.  Psychiatric: Normal judgment and insight. Alert and oriented x 3. Normal mood.     Labs on Admission: I have personally reviewed following labs and imaging studies  CBC: Recent Labs  Lab 04/04/18 2115  WBC 11.0*  NEUTROABS 8.4*  HGB 14.9  HCT 43.8  MCV 87.3  PLT 252   Basic Metabolic Panel: Recent Labs  Lab 04/04/18 2115  NA 139  K 3.4*  CL 101  CO2 18*  GLUCOSE 120*  BUN 24*  CREATININE 2.04*  CALCIUM 10.1   GFR: Estimated Creatinine Clearance: 64.3 mL/min (A) (by C-G formula based on SCr of 2.04 mg/dL (H)). Liver Function Tests: Recent Labs  Lab 04/04/18 2115  AST 51*  ALT 47*  ALKPHOS 75  BILITOT 1.2  PROT 9.2*  ALBUMIN 5.3*   No results for input(s): LIPASE, AMYLASE in the last 168 hours. No results for input(s): AMMONIA in the last 168 hours. Coagulation Profile: No results for input(s): INR, PROTIME in the last 168 hours. Cardiac Enzymes: Recent Labs  Lab 04/04/18 2115  CKTOTAL 937*   BNP (last 3 results) No results for input(s): PROBNP in the last 8760 hours. HbA1C: No results for input(s): HGBA1C in the last 72 hours. CBG: No results for input(s):  GLUCAP in the last 168 hours. Lipid Profile: No results for input(s): CHOL, HDL, LDLCALC, TRIG, CHOLHDL, LDLDIRECT in the last 72 hours. Thyroid Function Tests: No results for input(s): TSH, T4TOTAL, FREET4, T3FREE, THYROIDAB in the last 72 hours. Anemia Panel: No results for input(s): VITAMINB12, FOLATE, FERRITIN, TIBC, IRON, RETICCTPCT in the last 72 hours. Urine analysis: No results found for: COLORURINE, APPEARANCEUR, LABSPEC, PHURINE, GLUCOSEU, HGBUR, BILIRUBINUR, KETONESUR, PROTEINUR, UROBILINOGEN, NITRITE, LEUKOCYTESUR Sepsis Labs: No results found for this or any previous visit (from the past 240 hour(s)).   Radiological Exams on Admission: Dg Chest 2 View  Result Date: 04/04/2018 CLINICAL DATA:  Chest pain. EXAM: CHEST - 2 VIEW COMPARISON:  05/31/2016 FINDINGS: The cardiomediastinal contours are normal. The lungs are clear. Pulmonary vasculature is normal. No consolidation, pleural effusion, or pneumothorax. No acute osseous abnormalities are seen. IMPRESSION: Negative radiographs of the chest. Electronically Signed   By: Narda Rutherford M.D.   On: 04/04/2018 22:07    EKG: Independently reviewed.  Sinus tachycardia at 118 bpm  Assessment/Plan Acute kidney injury 2/2 dehydration, mild rhabdomyolysis: Acute.  Patient presents with complaints of cramps.  Found to have a creatinine of 2.04 with BUN 24, and CK mildly elevated at 937.  Previously noted to have normal kidney function back in 2014.  Suspect possible heat exhaustion. - Admit to MedSurg bed - Follow-up urinalysis - IV fluids at 125 mL/h  - Monitor intake and output - Recheck kidney function in a.m. - Correct electrolyte abnormalities as needed  Leukocytosis: WBC elevated at 11 on admission.  Chest x-ray was otherwise clear and urinalysis was negative for any significant signs of infection.  This could be reactive in nature due to acute stress response. - Recheck CBC in a.m.  Hypokalemia: Acute.  Initial potassium  mildly low at 3.4. - Give 40 mEq of potassium chloride x1 dose  - Continue to monitor and replace as needed  Metabolic acidosis, increased anion gap acute initial CO2 18 with anion gap 20.  Unclear cause of symptoms at this time.  Patient denies any suicidal ideations or ingestion. - continue to monitor  Transaminitis: Acute.  Patient with mild elevations in AST and ALT that can be possibly related to acute dehydration. - Continue to monitor  Depression - Continue Lexapro  Tobacco use: Patient reports smoking approximately 5 cigarettes/day on average. - Counsel on need of cessation of tobacco use.  Obesity: BMI 39.53  DVT prophylaxis: Lovenox Code Status: Full Family Communication: No family present at bedside Disposition Plan: Likely discharge home once medically stable Consults called: none  Admission status: observation  Clydie Braun MD [facility] Pager [phone]   If 7PM-7AM, please contact night-coverage [email or web address] Password [email or web address]  04/05/2018, 12:07 AM

## 2019-05-16 IMAGING — CR DG CHEST 2V
2 series · 2 of 2 positions shown · non-contrast
Comparison: 05/31/2016

CLINICAL DATA: Chest pain.

EXAM:
CHEST - 2 VIEW

[w chest lat]
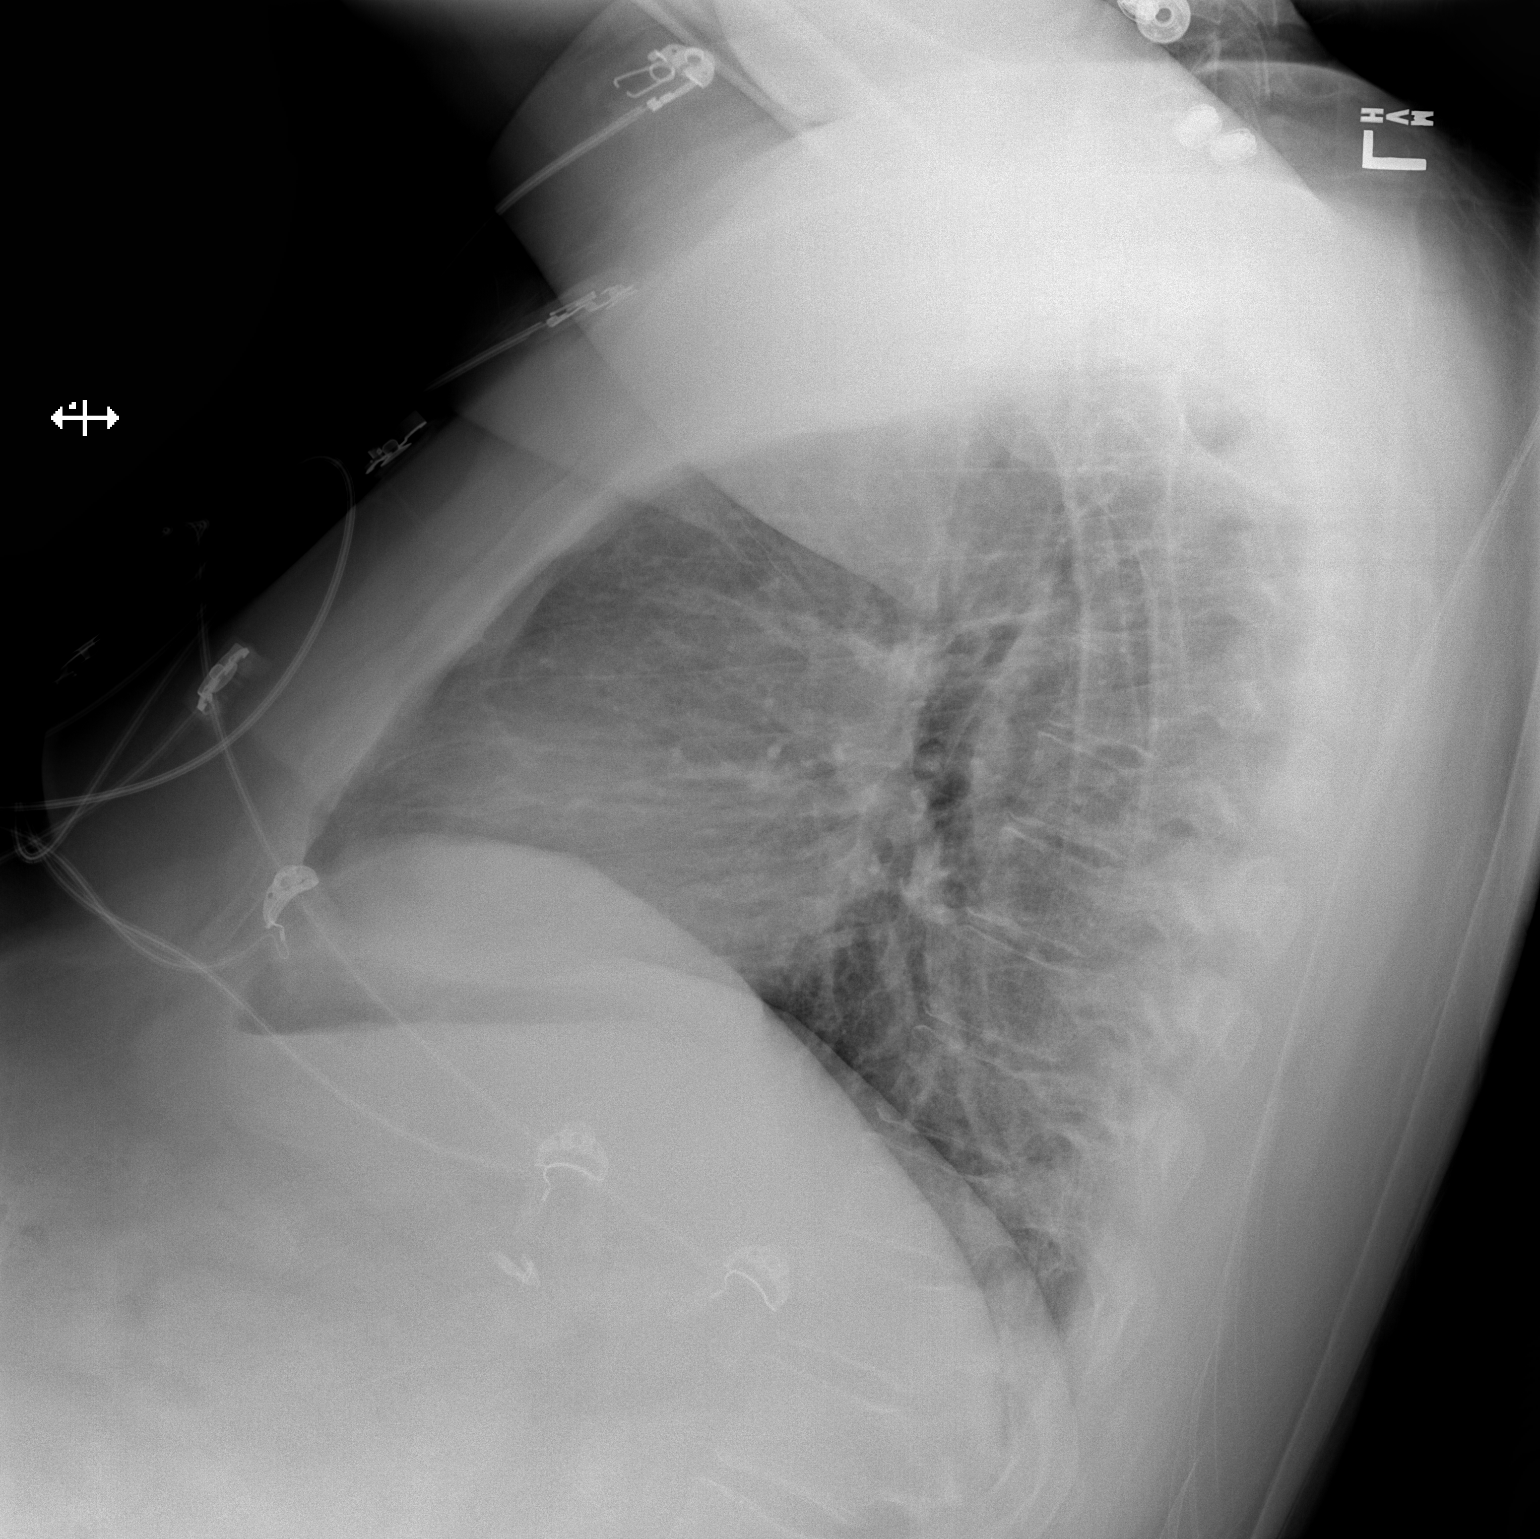

[x chest ap]
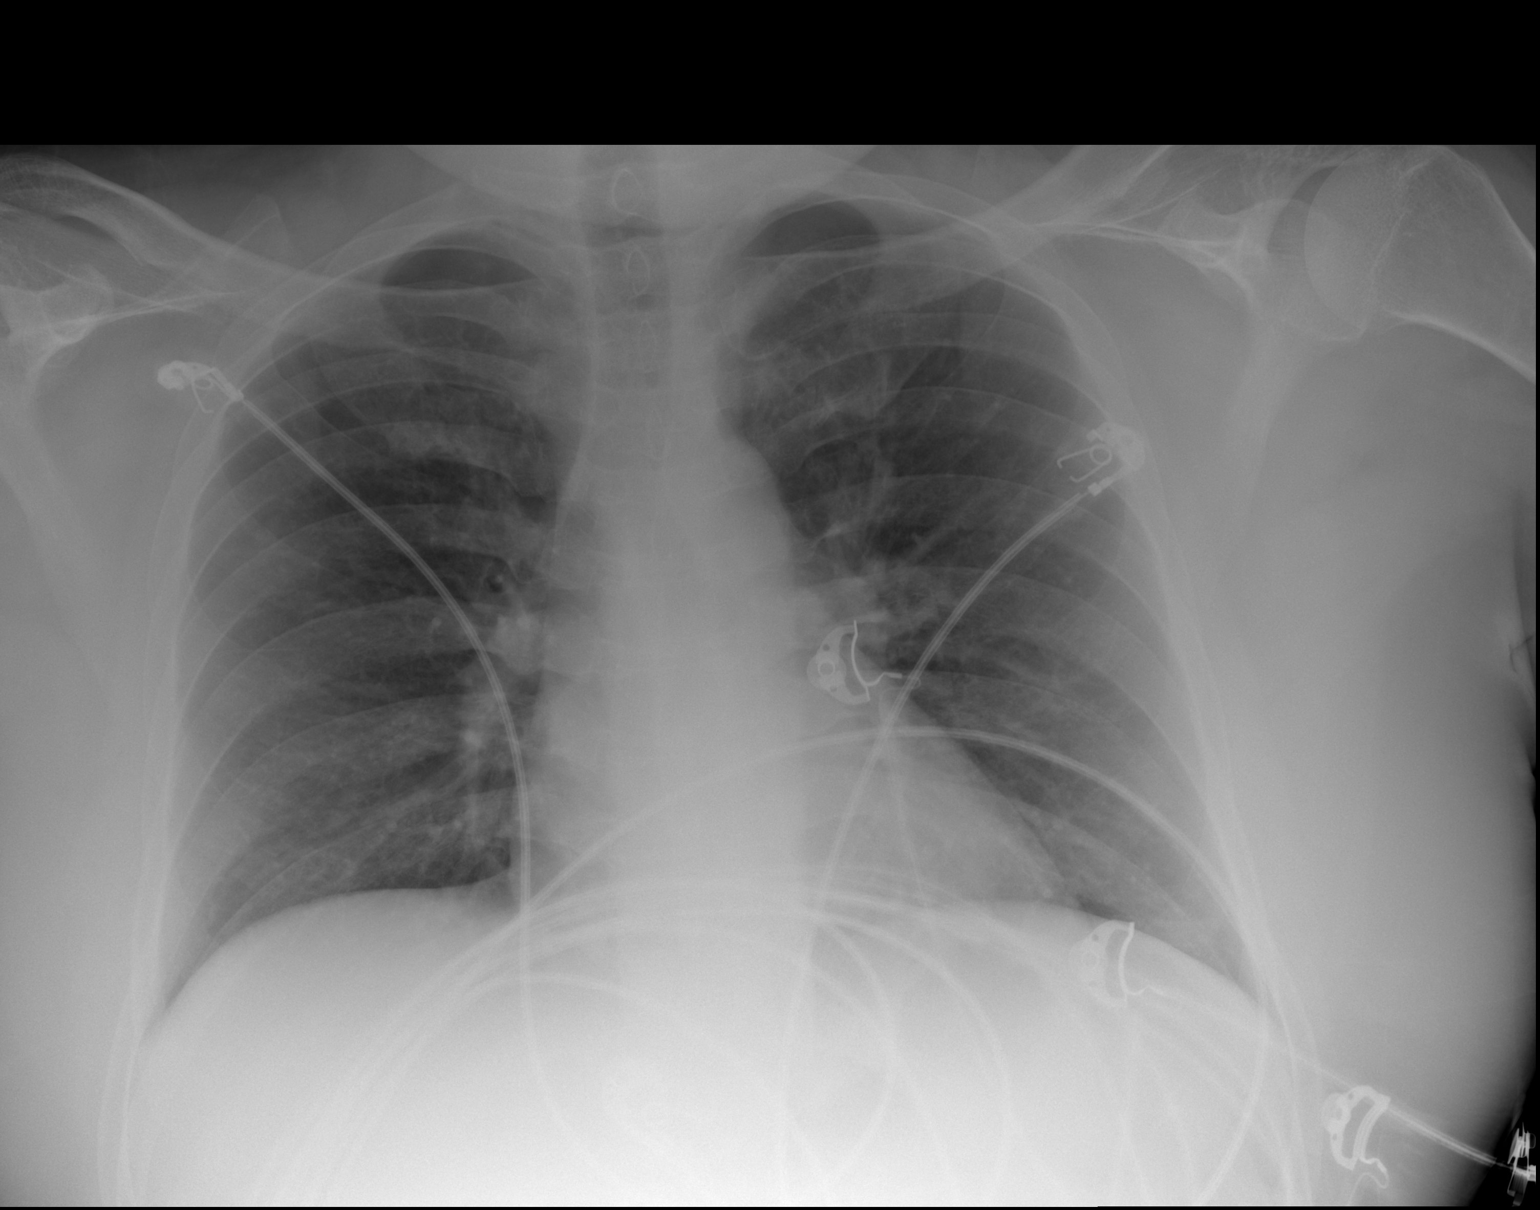

[2 of 2 positions shown; findings below may reference images not displayed]

FINDINGS: The cardiomediastinal contours are normal. The lungs are clear.
Pulmonary vasculature is normal. No consolidation, pleural effusion,
or pneumothorax. No acute osseous abnormalities are seen.
IMPRESSION: Negative radiographs of the chest.

## 2022-11-24 ENCOUNTER — Emergency Department (HOSPITAL_COMMUNITY): Payer: Medicaid Other

## 2022-11-24 ENCOUNTER — Inpatient Hospital Stay (HOSPITAL_COMMUNITY)
Admission: EM | Admit: 2022-11-24 | Discharge: 2022-11-27 | DRG: 871 | Disposition: A | Discharge status: Home / Self Care | Payer: Medicaid Other | Attending: Family Medicine | Admitting: Family Medicine

## 2022-11-24 ENCOUNTER — Other Ambulatory Visit: Payer: Self-pay

## 2022-11-24 ENCOUNTER — Encounter (HOSPITAL_COMMUNITY): Payer: Self-pay

## 2022-11-24 DIAGNOSIS — Z6839 Body mass index (BMI) 39.0-39.9, adult: Secondary | ICD-10-CM

## 2022-11-24 DIAGNOSIS — J440 Chronic obstructive pulmonary disease with acute lower respiratory infection: Secondary | ICD-10-CM | POA: Diagnosis present

## 2022-11-24 DIAGNOSIS — A419 Sepsis, unspecified organism: Principal | ICD-10-CM | POA: Diagnosis present

## 2022-11-24 DIAGNOSIS — N62 Hypertrophy of breast: Secondary | ICD-10-CM | POA: Diagnosis present

## 2022-11-24 DIAGNOSIS — Z79899 Other long term (current) drug therapy: Secondary | ICD-10-CM

## 2022-11-24 DIAGNOSIS — I4711 Inappropriate sinus tachycardia, so stated: Secondary | ICD-10-CM | POA: Diagnosis not present

## 2022-11-24 DIAGNOSIS — E876 Hypokalemia: Secondary | ICD-10-CM | POA: Diagnosis present

## 2022-11-24 DIAGNOSIS — R000 Tachycardia, unspecified: Secondary | ICD-10-CM | POA: Diagnosis not present

## 2022-11-24 DIAGNOSIS — F17210 Nicotine dependence, cigarettes, uncomplicated: Secondary | ICD-10-CM | POA: Diagnosis present

## 2022-11-24 DIAGNOSIS — J189 Pneumonia, unspecified organism: Secondary | ICD-10-CM | POA: Diagnosis present

## 2022-11-24 DIAGNOSIS — Z1152 Encounter for screening for COVID-19: Secondary | ICD-10-CM | POA: Diagnosis not present

## 2022-11-24 DIAGNOSIS — J9601 Acute respiratory failure with hypoxia: Secondary | ICD-10-CM | POA: Diagnosis present

## 2022-11-24 DIAGNOSIS — F32A Depression, unspecified: Secondary | ICD-10-CM | POA: Diagnosis present

## 2022-11-24 DIAGNOSIS — J441 Chronic obstructive pulmonary disease with (acute) exacerbation: Secondary | ICD-10-CM | POA: Diagnosis present

## 2022-11-24 DIAGNOSIS — I10 Essential (primary) hypertension: Secondary | ICD-10-CM | POA: Diagnosis present

## 2022-11-24 DIAGNOSIS — Z8249 Family history of ischemic heart disease and other diseases of the circulatory system: Secondary | ICD-10-CM

## 2022-11-24 DIAGNOSIS — E669 Obesity, unspecified: Secondary | ICD-10-CM | POA: Diagnosis present

## 2022-11-24 LAB — CBC WITH DIFFERENTIAL/PLATELET
Abs Immature Granulocytes: 0.58 10*3/uL — ABNORMAL HIGH (ref 0.00–0.07)
Basophils Absolute: 0.1 10*3/uL (ref 0.0–0.1)
Basophils Relative: 0 %
Eosinophils Absolute: 0.2 10*3/uL (ref 0.0–0.5)
Eosinophils Relative: 1 %
HCT: 38.6 % — ABNORMAL LOW (ref 39.0–52.0)
Hemoglobin: 13 g/dL (ref 13.0–17.0)
Immature Granulocytes: 4 %
Lymphocytes Relative: 23 %
Lymphs Abs: 3.7 10*3/uL (ref 0.7–4.0)
MCH: 30.4 pg (ref 26.0–34.0)
MCHC: 33.7 g/dL (ref 30.0–36.0)
MCV: 90.4 fL (ref 80.0–100.0)
Monocytes Absolute: 1.2 10*3/uL — ABNORMAL HIGH (ref 0.1–1.0)
Monocytes Relative: 7 %
Neutro Abs: 10.5 10*3/uL — ABNORMAL HIGH (ref 1.7–7.7)
Neutrophils Relative %: 65 %
Platelets: 319 10*3/uL (ref 150–400)
RBC: 4.27 MIL/uL (ref 4.22–5.81)
RDW: 14 % (ref 11.5–15.5)
WBC: 16.1 10*3/uL — ABNORMAL HIGH (ref 4.0–10.5)
nRBC: 0 % (ref 0.0–0.2)

## 2022-11-24 LAB — COMPREHENSIVE METABOLIC PANEL
ALT: 35 U/L (ref 0–44)
AST: 25 U/L (ref 15–41)
Albumin: 3.4 g/dL — ABNORMAL LOW (ref 3.5–5.0)
Alkaline Phosphatase: 103 U/L (ref 38–126)
Anion gap: 13 (ref 5–15)
BUN: 11 mg/dL (ref 6–20)
CO2: 27 mmol/L (ref 22–32)
Calcium: 8.9 mg/dL (ref 8.9–10.3)
Chloride: 91 mmol/L — ABNORMAL LOW (ref 98–111)
Creatinine, Ser: 0.92 mg/dL (ref 0.61–1.24)
GFR, Estimated: >60 mL/min (ref >60)
Glucose, Bld: 103 mg/dL — ABNORMAL HIGH (ref 70–99)
Potassium: 3.2 mmol/L — ABNORMAL LOW (ref 3.5–5.1)
Sodium: 131 mmol/L — ABNORMAL LOW (ref 135–145)
Total Bilirubin: 0.8 mg/dL (ref 0.3–1.2)
Total Protein: 9.2 g/dL — ABNORMAL HIGH (ref 6.5–8.1)

## 2022-11-24 LAB — BLOOD GAS, VENOUS
Acid-Base Excess: 7.2 mmol/L — ABNORMAL HIGH (ref 0.0–2.0)
Bicarbonate: 31.2 mmol/L — ABNORMAL HIGH (ref 20.0–28.0)
O2 Saturation: 61.9 %
Patient temperature: 37
pCO2, Ven: 41 mmHg — ABNORMAL LOW (ref 44–60)
pH, Ven: 7.49 — ABNORMAL HIGH (ref 7.25–7.43)
pO2, Ven: 36 mmHg (ref 32–45)

## 2022-11-24 LAB — RESPIRATORY PANEL BY PCR

## 2022-11-24 LAB — D-DIMER, QUANTITATIVE: D-Dimer, Quant: 1.08 ug/mL-FEU — ABNORMAL HIGH (ref 0.00–0.50)

## 2022-11-24 LAB — BRAIN NATRIURETIC PEPTIDE: B Natriuretic Peptide: 18 pg/mL (ref 0.0–100.0)

## 2022-11-24 LAB — RESP PANEL BY RT-PCR (RSV, FLU A&B, COVID)  RVPGX2
Influenza A by PCR: NEGATIVE
Influenza B by PCR: NEGATIVE
Resp Syncytial Virus by PCR: NEGATIVE
SARS Coronavirus 2 by RT PCR: NEGATIVE

## 2022-11-24 LAB — LACTIC ACID, PLASMA: Lactic Acid, Venous: 2.5 mmol/L (ref 0.5–1.9)

## 2022-11-24 LAB — PROCALCITONIN: Procalcitonin: <0.1 ng/mL

## 2022-11-24 LAB — HIV ANTIBODY (ROUTINE TESTING W REFLEX): HIV Screen 4th Generation wRfx: NONREACTIVE

## 2022-11-24 LAB — TROPONIN I (HIGH SENSITIVITY)
Troponin I (High Sensitivity): 4 ng/L (ref <18)
Troponin I (High Sensitivity): 4 ng/L (ref <18)

## 2022-11-24 MED ORDER — LACTATED RINGERS IV BOLUS
1000.0000 mL | Freq: Once | INTRAVENOUS | Status: AC
  Administered 2022-11-24: 1000 mL via INTRAVENOUS

## 2022-11-24 MED ORDER — POTASSIUM CHLORIDE CRYS ER 20 MEQ PO TBCR
40.0000 meq | EXTENDED_RELEASE_TABLET | Freq: Once | ORAL | Status: AC
  Administered 2022-11-24: 40 meq via ORAL
  Filled 2022-11-24: qty 2

## 2022-11-24 MED ORDER — ONDANSETRON HCL 4 MG PO TABS: 4.0000 mg | ORAL_TABLET | Freq: Four times a day (QID) | ORAL | Status: DC | PRN

## 2022-11-24 MED ORDER — SODIUM CHLORIDE 0.9 % IV SOLN
2.0000 g | INTRAVENOUS | Status: DC
  Administered 2022-11-25 – 2022-11-27 (×3): 2 g via INTRAVENOUS
  Filled 2022-11-24 (×3): qty 20

## 2022-11-24 MED ORDER — ALBUTEROL (5 MG/ML) CONTINUOUS INHALATION SOLN: 10.0000 mg/h | INHALATION_SOLUTION | Freq: Once | RESPIRATORY_TRACT | Status: DC

## 2022-11-24 MED ORDER — SODIUM CHLORIDE 0.9 % IV SOLN
2.0000 g | Freq: Once | INTRAVENOUS | Status: AC
  Administered 2022-11-24: 2 g via INTRAVENOUS
  Filled 2022-11-24: qty 20

## 2022-11-24 MED ORDER — ESCITALOPRAM OXALATE 10 MG PO TABS: 10.0000 mg | ORAL_TABLET | Freq: Every day | ORAL | Status: DC

## 2022-11-24 MED ORDER — METHOCARBAMOL 500 MG PO TABS
750.0000 mg | ORAL_TABLET | Freq: Every day | ORAL | Status: DC | PRN
  Administered 2022-11-24: 750 mg via ORAL
  Filled 2022-11-24: qty 2

## 2022-11-24 MED ORDER — KETOROLAC TROMETHAMINE 15 MG/ML IJ SOLN
15.0000 mg | Freq: Once | INTRAMUSCULAR | Status: AC
  Administered 2022-11-24: 15 mg via INTRAVENOUS
  Filled 2022-11-24: qty 1

## 2022-11-24 MED ORDER — ONDANSETRON HCL 4 MG/2ML IJ SOLN: 4.0000 mg | Freq: Four times a day (QID) | INTRAMUSCULAR | Status: DC | PRN

## 2022-11-24 MED ORDER — IOHEXOL 350 MG/ML SOLN
100.0000 mL | Freq: Once | INTRAVENOUS | Status: AC | PRN
  Administered 2022-11-24: 100 mL via INTRAVENOUS

## 2022-11-24 MED ORDER — LACTATED RINGERS IV SOLN
150.0000 mL/h | INTRAVENOUS | Status: AC
  Administered 2022-11-24: 150 mL/h via INTRAVENOUS

## 2022-11-24 MED ORDER — SODIUM CHLORIDE 0.9 % IV SOLN
100.0000 mg | Freq: Two times a day (BID) | INTRAVENOUS | Status: DC
  Administered 2022-11-24 – 2022-11-27 (×6): 100 mg via INTRAVENOUS
  Filled 2022-11-24 (×7): qty 100

## 2022-11-24 MED ORDER — IPRATROPIUM-ALBUTEROL 0.5-2.5 (3) MG/3ML IN SOLN
9.0000 mL | Freq: Once | RESPIRATORY_TRACT | Status: AC
  Administered 2022-11-24: 9 mL via RESPIRATORY_TRACT
  Filled 2022-11-24: qty 9

## 2022-11-24 MED ORDER — ENOXAPARIN SODIUM 60 MG/0.6ML IJ SOSY
60.0000 mg | PREFILLED_SYRINGE | Freq: Every day | INTRAMUSCULAR | Status: DC
  Administered 2022-11-24 – 2022-11-25 (×2): 60 mg via SUBCUTANEOUS
  Filled 2022-11-24 (×3): qty 0.6

## 2022-11-24 MED ORDER — ACETAMINOPHEN 650 MG RE SUPP: 650.0000 mg | Freq: Four times a day (QID) | RECTAL | Status: DC | PRN

## 2022-11-24 MED ORDER — SODIUM CHLORIDE (PF) 0.9 % IJ SOLN
INTRAMUSCULAR | Status: AC
  Filled 2022-11-24: qty 50

## 2022-11-24 MED ORDER — SODIUM CHLORIDE 0.9 % IV SOLN
500.0000 mg | Freq: Once | INTRAVENOUS | Status: AC
  Administered 2022-11-24: 500 mg via INTRAVENOUS
  Filled 2022-11-24: qty 5

## 2022-11-24 MED ORDER — METHYLPREDNISOLONE SODIUM SUCC 125 MG IJ SOLR
125.0000 mg | Freq: Once | INTRAMUSCULAR | Status: AC
  Administered 2022-11-24: 125 mg via INTRAVENOUS
  Filled 2022-11-24: qty 2

## 2022-11-24 MED ORDER — ACETAMINOPHEN 325 MG PO TABS
650.0000 mg | ORAL_TABLET | Freq: Four times a day (QID) | ORAL | Status: DC | PRN
  Administered 2022-11-24: 650 mg via ORAL
  Filled 2022-11-24: qty 2

## 2022-11-24 MED ORDER — LACTATED RINGERS IV SOLN: INTRAVENOUS | Status: AC

## 2022-11-24 MED ORDER — ALBUTEROL SULFATE (2.5 MG/3ML) 0.083% IN NEBU
10.0000 mg | INHALATION_SOLUTION | Freq: Once | RESPIRATORY_TRACT | Status: AC
  Administered 2022-11-24: 10 mg via RESPIRATORY_TRACT
  Filled 2022-11-24: qty 12

## 2022-11-24 NOTE — ED Notes (Signed)
Admitting MD at the bedside.  

## 2022-11-24 NOTE — Assessment & Plan Note (Signed)
Continue Lexapro

## 2022-11-24 NOTE — Assessment & Plan Note (Signed)
Supplement potassium Check magnesium level 

## 2022-11-24 NOTE — ED Triage Notes (Signed)
Pt to er, pt states that he is here for a cough that he has had for the past month, pt talking in full sentences, resps even and unlabored. Pt states that his cough is worse at night.

## 2022-11-24 NOTE — Assessment & Plan Note (Signed)
As evidenced by low-grade fever with a Tmax of 99, tachycardia, tachypnea, leukocytosis, lactate is pending and imaging suggestive of multifocal pneumonia with associated acute respiratory failure.  Patient had room air pulse oximetry of 86% and was tachypneic currently requiring 2 L of oxygen to maintain pulse oximetry of 92% Aggressive IV fluid resuscitation using (IBW 68kg) Will treat patient empirically with Rocephin and doxycycline. Patient was already treated with Zithromax as an outpatient Follow-up results of blood cultures Obtain procalcitonin levels as well as respiratory viral panel

## 2022-11-24 NOTE — ED Notes (Signed)
Patient transported to CT 

## 2022-11-24 NOTE — H&P (Signed)
History and Physical    Patient: Leroy Lindsey ZOX:[id number] DOB: 04/12/1986 DOA: 11/24/2022 DOS: the patient was seen and examined on 11/24/2022 PCP: Patient, No Pcp Per  Patient coming from: Home  Chief Complaint:  Chief Complaint  Patient presents with   Cough   HPI: Steed Kanaan is a 37 y.o. male with medical history significant for asthma, depression, morbid obesity (BMI 39) who presents to the emergency room for evaluation of refractory cough. Patient said his symptoms started a month ago with a cough that has been productive of yellow phlegm associated with chills and occasional fever.  He was seen at the hospital in [place] and was diagnosed with acute bronchitis and discharged home on a course of steroids and a [name] which he completed without any improvement in his symptoms.  He went back to the hospital because he did not feel well and was told he most likely had an acute viral respiratory illness that required supportive care. Over the last 2 weeks his mother states that he he has been unable to sleep laying flat due to this refractory cough and he now has shortness of breath with exertion.  Patient states the cough is not productive of yellow phlegm and he usually gets chills and fevers at night as well as night sweats.  He also complains of pleuritic chest pain.  Due to the persistence of his symptoms he was brought into the ER for further evaluation. He denies having any changes in his bowel habits, no nausea, no vomiting, no dizziness, no lightheadedness, no urinary symptoms, no blurred vision, no leg swelling, no focal deficit.  Vital signs upon arrival to the ER, Tmax 99, tachycardia with heart rate in the 130s, tachypnea with respiratory rate in the 30s and room air pulse oximetry of 86% requiring oxygen supplementation at 2 L to maintain pulse oximetry of 91% Abnormal labs include marked leukocytosis, D-dimer 1.08, potassium of 3.2 Chest x-ray reviewed by me shows subtle  bilateral patchy ill-defined opacity suggested compared to the prior examination. An infiltrative process is possible. CT angiogram of the chest showed no evidence of pulmonary embolism. Diffuse bilateral predominantly ground-glass airspace opacities. Findings are favored to represent multifocal pneumonia although pulmonary edema is also a consideration. Mildly enlarged mediastinal and right hilar lymph nodes, likely reactive. Prominent right-sided gynecomastia.   Review of Systems: As mentioned in the history of present illness. All other systems reviewed and are negative. Past Medical History:  Diagnosis Date   Asthma    seasonal   Depression    Past Surgical History:  Procedure Laterality Date   TYMPANOSTOMY TUBE PLACEMENT     WISDOM TOOTH EXTRACTION     Social History:  reports that he has been smoking cigarettes. He has never used smokeless tobacco. He reports that he does not drink alcohol and does not use drugs.  No Known Allergies  Family History  Problem Relation Age of Onset   Hypertension Mother     Prior to Admission medications   Medication Sig Start Date End Date Taking? Authorizing Provider  escitalopram (LEXAPRO) 10 MG tablet Take 10 mg by mouth daily.    [provider]  methocarbamol (ROBAXIN) 750 MG tablet Take 750 mg by mouth daily as needed for muscle spasms.    [provider]    Physical Exam: Vitals:   11/24/22 1200 11/24/22 1331 11/24/22 1438 11/24/22 1527  BP: (!) 147/87   (!) 146/96  Pulse: (!) 119   (!) 134  Resp: 18   (!)  30  Temp:   97.6 F (36.4 C) 98 F (36.7 C)  TempSrc:   Oral Oral  SpO2: 99% 93%  95%  Weight:      Height:       Physical Exam Vitals and nursing note reviewed.  Constitutional:      Appearance: He is obese.     Comments: Noted to be tachypneic and has conversational dyspnea  HENT:     Head: Normocephalic and atraumatic.     Nose: Nose normal.     Mouth/Throat:     Mouth: Mucous membranes are  moist.  Eyes:     Conjunctiva/sclera: Conjunctivae normal.  Cardiovascular:     Rate and Rhythm: Tachycardia present.  Pulmonary:     Breath sounds: Wheezing and rhonchi present.     Comments: Scattered rhonchi and wheezes Abdominal:     General: Bowel sounds are normal.     Palpations: Abdomen is soft.     Comments: Central adiposity  Musculoskeletal:        General: Normal range of motion.     Cervical back: Normal range of motion and neck supple.  Skin:    General: Skin is warm and dry.  Neurological:     General: No focal deficit present.     Mental Status: He is alert and oriented to person, place, and time.  Psychiatric:        Mood and Affect: Mood normal.        Behavior: Behavior normal.     Data Reviewed: Relevant notes from primary care and specialist visits, past discharge summaries as available in EHR, including Care Everywhere. Prior diagnostic testing as pertinent to current admission diagnoses Updated medications and problem lists for reconciliation ED course, including vitals, labs, imaging, treatment and response to treatment Triage notes, nursing and pharmacy notes and ED provider's notes Notable results as noted in HPI Labs reviewed.  Sodium 131, potassium 3.2, chloride 91, bicarb 27, glucose 103, BUN 11, creatinine 0.92, calcium 8.9, total protein 9.2, albumin 3.4, AST 25, ALT 35, alkaline phosphatase 103, total bilirubin 0.8, D-dimer 1.08, white count 16.1, hemoglobin 13.0, hematocrit 38.6, platelet count 319 VBG 7.49/41/36/31.2 There are no new results to review at this time.  Assessment and Plan: * Sepsis due to pneumonia [facility]) As evidenced by low-grade fever with a Tmax of 99, tachycardia, tachypnea, leukocytosis, lactate is pending and imaging suggestive of multifocal pneumonia with associated acute respiratory failure.  Patient had room air pulse oximetry of 86% and was tachypneic currently requiring 2 L of oxygen to maintain pulse oximetry of  92% Aggressive IV fluid resuscitation using (IBW 68kg) Will treat patient empirically with Rocephin and doxycycline. Patient was already treated with Zithromax as an outpatient Follow-up results of blood cultures Obtain procalcitonin levels as well as respiratory viral panel  Acute respiratory failure with hypoxia ([name]) Secondary to multifocal pneumonia rule out atypical pneumonia secondary to viral infection. Patient had room air pulse oximetry of 86% with associated tachypnea and is currently on 2 L of oxygen to maintain pulse oximetry of 92% We will attempt to wean patient off oxygen once acute illness improves or resolves  Depression Continue Lexapro  Obesity BMI 39.53 Complicates overall prognosis and care  Hypokalemia Supplement potassium Check magnesium level      Advance Care Planning:   Code Status: Full Code   Consults: None  Family Communication: Greater than 50% of time was spent discussing patient's condition and plan of care with him and his mother  at the bedside.  All questions and concerns have been addressed.  They verbalized understanding and agree with the plan.  Severity of Illness: The appropriate patient status for this patient is INPATIENT. Inpatient status is judged to be reasonable and necessary in order to provide the required intensity of service to ensure the patient's safety. The patient's presenting symptoms, physical exam findings, and initial radiographic and laboratory data in the context of their chronic comorbidities is felt to place them at high risk for further clinical deterioration. Furthermore, it is not anticipated that the patient will be medically stable for discharge from the hospital within 2 midnights of admission.   * I certify that at the point of admission it is my clinical judgment that the patient will require inpatient hospital care spanning beyond 2 midnights from the point of admission due to high intensity of service, high risk  for further deterioration and high frequency of surveillance required.*  Author: Lucile Shutters, MD 11/24/2022 4:13 PM  For on call review [email or web address].

## 2022-11-24 NOTE — ED Notes (Signed)
Report was given to floor, they state they would like LA result before transfer

## 2022-11-24 NOTE — ED Notes (Signed)
ED TO INPATIENT HANDOFF REPORT  ED Nurse Name and Phone #: Gearlean Alf Name/Age/Gender Leroy Lindsey 37 y.o. male Room/Bed: WA20/WA20  Code Status   Code Status: Full Code  Home/SNF/Other Home Patient oriented to: self, place, time, and situation Is this baseline? Yes   Triage Complete: Triage complete  Chief Complaint Multifocal pneumonia [J18.9]  Triage Note Pt to er, pt states that he is here for a cough that he has had for the past month, pt talking in full sentences, resps even and unlabored. Pt states that his cough is worse at night.     Allergies No Known Allergies  Level of Care/Admitting Diagnosis ED Disposition     ED Disposition  Admit   Condition  --   Comment  Hospital Area: [facility] [[id number]]  Level of Care: Progressive [102]  Admit to Progressive based on following criteria: MULTISYSTEM THREATS such as stable sepsis, metabolic/electrolyte imbalance with or without encephalopathy that is responding to early treatment.  May admit patient to [facility] or [facility] if equivalent level of care is available:: Yes  Covid Evaluation: Asymptomatic - no recent exposure (last 10 days) testing not required  Diagnosis: Multifocal pneumonia [[id number]]  Admitting Physician: Lonia Mad  Attending Physician: Lonia Mad  Certification:: I certify this patient will need inpatient services for at least 2 midnights          B Medical/Surgery History Past Medical History:  Diagnosis Date   Asthma    seasonal   Depression    Past Surgical History:  Procedure Laterality Date   TYMPANOSTOMY TUBE PLACEMENT     WISDOM TOOTH EXTRACTION       A IV Location/Drains/Wounds Patient Lines/Drains/Airways Status     Active Line/Drains/Airways     Name Placement date Placement time Site Days   Peripheral IV 11/24/22 20 G 1" Anterior;Distal;Left;Upper Arm 11/24/22  1147  Arm  less than 1             Intake/Output Last 24 hours No intake or output data in the 24 hours ending 11/24/22 1550  Labs/Imaging Results for orders placed or performed during the hospital encounter of 11/24/22 (from the past 48 hour(s))  Comprehensive metabolic panel     Status: Abnormal   Collection Time: 11/24/22 11:50 AM  Result Value Ref Range   Sodium 131 (L) 135 - 145 mmol/L   Potassium 3.2 (L) 3.5 - 5.1 mmol/L   Chloride 91 (L) 98 - 111 mmol/L   CO2 27 22 - 32 mmol/L   Glucose, Bld 103 (H) 70 - 99 mg/dL    Comment: Glucose reference range applies only to samples taken after fasting for at least 8 hours.   BUN 11 6 - 20 mg/dL   Creatinine, Ser 6.29 0.61 - 1.24 mg/dL   Calcium 8.9 8.9 - 52.8 mg/dL   Total Protein 9.2 (H) 6.5 - 8.1 g/dL   Albumin 3.4 (L) 3.5 - 5.0 g/dL   AST 25 15 - 41 U/L   ALT 35 0 - 44 U/L   Alkaline Phosphatase 103 38 - 126 U/L   Total Bilirubin 0.8 0.3 - 1.2 mg/dL   GFR, Estimated >41 >32 mL/min    Comment: (NOTE) Calculated using the CKD-EPI Creatinine Equation (2021)    Anion gap 13 5 - 15    Comment: Performed at [facility], [address] [place], [place] [address]  CBC with Differential     Status: Abnormal  Collection Time: 11/24/22 11:50 AM  Result Value Ref Range   WBC 16.1 (H) 4.0 - 10.5 K/uL   RBC 4.27 4.22 - 5.81 MIL/uL   Hemoglobin 13.0 13.0 - 17.0 g/dL   HCT 47.8 (L) 29.5 - 62.1 %   MCV 90.4 80.0 - 100.0 fL   MCH 30.4 26.0 - 34.0 pg   MCHC 33.7 30.0 - 36.0 g/dL   RDW 30.8 65.7 - 84.6 %   Platelets 319 150 - 400 K/uL   nRBC 0.0 0.0 - 0.2 %   Neutrophils Relative % 65 %   Neutro Abs 10.5 (H) 1.7 - 7.7 K/uL   Lymphocytes Relative 23 %   Lymphs Abs 3.7 0.7 - 4.0 K/uL   Monocytes Relative 7 %   Monocytes Absolute 1.2 (H) 0.1 - 1.0 K/uL   Eosinophils Relative 1 %   Eosinophils Absolute 0.2 0.0 - 0.5 K/uL   Basophils Relative 0 %   Basophils Absolute 0.1 0.0 - 0.1 K/uL   Immature Granulocytes 4 %   Abs Immature Granulocytes 0.58  (H) 0.00 - 0.07 K/uL    Comment: Performed at [facility], [address], [place], [place] [address]  Troponin I (High Sensitivity)     Status: None   Collection Time: 11/24/22 11:50 AM  Result Value Ref Range   Troponin I (High Sensitivity) 4 <18 ng/L    Comment: (NOTE) Elevated high sensitivity troponin I (hsTnI) values and significant  changes across serial measurements may suggest ACS but many other  chronic and acute conditions are known to elevate hsTnI results.  Refer to the "Links" section for chest pain algorithms and additional  guidance. Performed at [facility], [address], [place], [place] [address]   Brain natriuretic peptide     Status: None   Collection Time: 11/24/22 11:50 AM  Result Value Ref Range   B Natriuretic Peptide 18.0 0.0 - 100.0 pg/mL    Comment: Performed at [facility], [address], [place], [place] [address]  D-dimer, quantitative     Status: Abnormal   Collection Time: 11/24/22 11:50 AM  Result Value Ref Range   D-Dimer, Quant 1.08 (H) 0.00 - 0.50 ug/mL-FEU    Comment: (NOTE) At the manufacturer cut-off value of 0.5 g/mL FEU, this assay has a negative predictive value of 95-100%.This assay is intended for use in conjunction with a clinical pretest probability (PTP) assessment model to exclude pulmonary embolism (PE) and deep venous thrombosis (DVT) in outpatients suspected of PE or DVT. Results should be correlated with clinical presentation. Performed at [facility], [address], [place], [place] [address]   Blood gas, venous (at [facility] and [facility])     Status: Abnormal   Collection Time: 11/24/22 11:50 AM  Result Value Ref Range   pH, Ven 7.49 (H) 7.25 - 7.43   pCO2, Ven 41 (L) 44 - 60 mmHg   pO2, Ven 36 32 - 45 mmHg   Bicarbonate 31.2 (H) 20.0 - 28.0 mmol/L   Acid-Base Excess 7.2 (H) 0.0 - 2.0 mmol/L   O2 Saturation 61.9 %   Patient temperature 37.0      Comment: Performed at [facility], [address] [place], [place] [address]  Troponin I (High Sensitivity)     Status: None   Collection Time: 11/24/22  1:48 PM  Result Value Ref Range   Troponin I (High Sensitivity) 4 <18 ng/L    Comment: (NOTE) Elevated high  sensitivity troponin I (hsTnI) values and significant  changes across serial measurements may suggest ACS but many other  chronic and acute conditions are known to elevate hsTnI results.  Refer to the "Links" section for chest pain algorithms and additional  guidance. Performed at [facility], [address], [place], [place] [address]    CT Angio Chest PE W and/or Wo Contrast  Result Date: 11/24/2022 CLINICAL DATA:  Pulmonary embolism (PE) suspected, high prob EXAM: CT ANGIOGRAPHY CHEST WITH CONTRAST TECHNIQUE: Multidetector CT imaging of the chest was performed using the standard protocol during bolus administration of intravenous contrast. Multiplanar CT image reconstructions and MIPs were obtained to evaluate the vascular anatomy. RADIATION DOSE REDUCTION: This exam was performed according to the departmental dose-optimization program which includes automated exposure control, adjustment of the mA and/or kV according to patient size and/or use of iterative reconstruction technique. CONTRAST:  OMNIPAQUE IOHEXOL 350 MG/ML SOLN COMPARISON:  Chest x-ray 11/24/2022 FINDINGS: Cardiovascular: Satisfactory opacification of the pulmonary arteries to the segmental level. No evidence of pulmonary embolism. Thoracic aorta is normal in course and caliber. Normal heart size. No pericardial effusion. Mediastinum/Nodes: Mildly enlarged right paratracheal lymph node measuring 11 mm (series 4, image 41). Mildly enlarged AP window node measuring 10 mm (series 4, image 48). 10 mm right hilar node (series 4, image 54). No enlarged axillary or left hilar lymph nodes. Thyroid gland, trachea, and esophagus are within  normal limits. Lungs/Pleura: Diffuse interstitial and alveolar opacities bilaterally, which are predominantly ground-glass. Airspace disease is most confluent within the bilateral upper lobes and within the right middle lobe. There is a small amount of atelectasis within the lingula. No pleural effusion or pneumothorax. Upper Abdomen: No acute abnormality. Musculoskeletal: Prominent right-sided gynecomastia. No acute or significant osseous abnormality. Review of the MIP images confirms the above findings. IMPRESSION: 1. No evidence of pulmonary embolism. 2. Diffuse bilateral predominantly ground-glass airspace opacities. Findings are favored to represent multifocal pneumonia although pulmonary edema is also a consideration. 3. Mildly enlarged mediastinal and right hilar lymph nodes, likely reactive. 4. Prominent right-sided gynecomastia. Electronically Signed   By: Duanne Guess D.O.   On: 11/24/2022 14:34   DG Chest 2 View  Result Date: 11/24/2022 CLINICAL DATA:  Cough worsening over the last month EXAM: CHEST - 2 VIEW COMPARISON:  X-ray 11/14/2022 FINDINGS: No pneumothorax or effusion. Normal cardiopericardial silhouette. Subtle hazy bilateral ill-defined parenchymal opacities are identified. A subtle infiltrate is possible. Possibilities include surgical clips in the upper abdomen. Atypical or viral process. IMPRESSION: Subtle bilateral patchy ill-defined opacity suggested compared to the prior examination. An infiltrative process is possible. Recommend follow-up Electronically Signed   By: Karen Kays M.D.   On: 11/24/2022 11:20    Pending Labs Unresulted Labs (From admission, onward)     Start     Ordered   12/01/22 0500  Creatinine, serum  (enoxaparin (LOVENOX)    CrCl >/= 30 ml/min)  Weekly,   R     Comments: while on enoxaparin therapy    11/24/22 1548   11/25/22 0500  Protime-INR  Tomorrow morning,   R        11/24/22 1548   11/25/22 0500  Cortisol-am, blood  Tomorrow morning,   R         11/24/22 1548   11/25/22 0500  Procalcitonin  Tomorrow morning,   R       References:    Procalcitonin Lower Respiratory Tract Infection AND Sepsis Procalcitonin Algorithm   11/24/22  1548   11/25/22 0500  CBC  Tomorrow morning,   R        11/24/22 1548   11/25/22 0500  Basic metabolic panel  Tomorrow morning,   R        11/24/22 1548   11/24/22 1549  Lactic acid, plasma  STAT Now then every 3 hours,   R (with STAT occurrences)      11/24/22 1548   11/24/22 1545  HIV Antibody (routine testing w rflx)  (HIV Antibody (Routine testing w reflex) panel)  Once,   R        11/24/22 1548   11/24/22 1521  Resp panel by RT-PCR (RSV, Flu A&B, Covid) Anterior Nasal Swab  Once,   URGENT        11/24/22 1520            Vitals/Pain Today's Vitals   11/24/22 1200 11/24/22 1331 11/24/22 1438 11/24/22 1527  BP: (!) 147/87   (!) 146/96  Pulse: (!) 119   (!) 134  Resp: 18   (!) 30  Temp:   97.6 F (36.4 C) 98 F (36.7 C)  TempSrc:   Oral Oral  SpO2: 99% 93%  95%  Weight:      Height:      PainSc:    8     Isolation Precautions No active isolations  Medications Medications  azithromycin (ZITHROMAX) 500 mg in sodium chloride 0.9 % 250 mL IVPB (500 mg Intravenous New Bag/Given 11/24/22 1525)  potassium chloride SA (KLOR-CON M) CR tablet 40 mEq (has no administration in time range)  lactated ringers bolus 1,000 mL (has no administration in time range)  lactated ringers bolus 1,000 mL (has no administration in time range)  escitalopram (LEXAPRO) tablet 10 mg (has no administration in time range)  methocarbamol (ROBAXIN) tablet 750 mg (has no administration in time range)  lactated ringers infusion (has no administration in time range)  enoxaparin (LOVENOX) injection 40 mg (has no administration in time range)  cefTRIAXone (ROCEPHIN) 2 g in sodium chloride 0.9 % 100 mL IVPB (has no administration in time range)  acetaminophen (TYLENOL) tablet 650 mg (has no administration in time range)    Or   acetaminophen (TYLENOL) suppository 650 mg (has no administration in time range)  ondansetron (ZOFRAN) tablet 4 mg (has no administration in time range)    Or  ondansetron (ZOFRAN) injection 4 mg (has no administration in time range)  doxycycline (VIBRAMYCIN) 100 mg in sodium chloride 0.9 % 250 mL IVPB (has no administration in time range)  ipratropium-albuterol (DUONEB) 0.5-2.5 (3) MG/3ML nebulizer solution 9 mL (9 mLs Nebulization Given 11/24/22 1154)  methylPREDNISolone sodium succinate (SOLU-MEDROL) 125 mg/2 mL injection 125 mg (125 mg Intravenous Given 11/24/22 1151)  albuterol (PROVENTIL) (2.5 MG/3ML) 0.083% nebulizer solution 10 mg (10 mg Nebulization Given 11/24/22 1331)  iohexol (OMNIPAQUE) 350 MG/ML injection 100 mL (100 mLs Intravenous Contrast Given 11/24/22 1406)  ketorolac (TORADOL) 15 MG/ML injection 15 mg (15 mg Intravenous Given 11/24/22 1515)  cefTRIAXone (ROCEPHIN) 2 g in sodium chloride 0.9 % 100 mL IVPB (0 g Intravenous Stopped 11/24/22 1549)    Mobility walks     Focused Assessments Pulmonary Assessment Handoff:  Lung sounds: Bilateral Breath Sounds: Diminished O2 Device: Nasal Cannula O2 Flow Rate (L/min): 4 L/min    R Recommendations: See Admitting Provider Note  Report given to:   Additional Notes: Mother is at the bedside

## 2022-11-24 NOTE — Progress Notes (Signed)
   11/24/22 1800  Assess: MEWS Score  Temp 98.5 F (36.9 C)  BP (!) 156/94  MAP (mmHg) 107  Pulse Rate (!) 123  Resp (!) 22  Level of Consciousness Alert  SpO2 99 %  O2 Device Nasal Cannula  O2 Flow Rate (L/min) 3 L/min  Assess: MEWS Score  MEWS Temp 0  MEWS Systolic 0  MEWS Pulse 2  MEWS RR 1  MEWS LOC 0  MEWS Score 3  MEWS Score Color Yellow  Assess: if the MEWS score is Yellow or Red  Were vital signs taken at a resting state? Yes  Focused Assessment Change from prior assessment (see assessment flowsheet)  Does the patient meet 2 or more of the SIRS criteria? Yes  Does the patient have a confirmed or suspected source of infection? Yes  MEWS guidelines implemented  Yes, yellow  Treat  MEWS Interventions Considered administering scheduled or prn medications/treatments as ordered  Take Vital Signs  Increase Vital Sign Frequency  Yellow: Q2hr x1, continue Q4hrs until patient remains green for 12hrs  Escalate  MEWS: Escalate Yellow: Discuss with charge nurse and consider notifying provider and/or RRT  Notify: Charge Nurse/RN  Name of Charge Nurse/RN Notified [facility] RN  Provider Notification  Provider Name/Title Dr Joylene Igo  Date Provider Notified 11/24/22  Time Provider Notified 1812  Method of Notification Page  Notification Reason Other (Comment) (MEWS score)  Provider response No new orders  Notify: Rapid Response  Name of Rapid Response RN Notified Ethan RN  Date Rapid Response Notified 11/24/22  Time Rapid Response Notified 1816  Assess: SIRS CRITERIA  SIRS Temperature  0  SIRS Pulse 1  SIRS Respirations  1  SIRS WBC 1  SIRS Score Sum  3

## 2022-11-24 NOTE — Assessment & Plan Note (Signed)
BMI 39.53 Complicates overall prognosis and care

## 2022-11-24 NOTE — ED Notes (Signed)
Call to lab to ass respiratory panel to swab already sent

## 2022-11-24 NOTE — Assessment & Plan Note (Signed)
Secondary to multifocal pneumonia rule out atypical pneumonia secondary to viral infection. Patient had room air pulse oximetry of 86% with associated tachypnea and is currently on 2 L of oxygen to maintain pulse oximetry of 92% We will attempt to wean patient off oxygen once acute illness improves or resolves

## 2022-11-24 NOTE — ED Provider Notes (Signed)
[facility] FLOOR PROGRESSIVE CARE AND UROLOGY Provider Note  CSN: [id number] Arrival date & time: 11/24/22 1039  Chief Complaint(s) Cough  HPI Leroy Lindsey is a 37 y.o. male with PMH asthma who presents emergency department for evaluation of cough.  Patient states that he has a cough for the last 1 month and has been seen twice at [facility] emergency department and diagnosed with viral URI and postviral cough.  He is trialed multiple different medicines including Mucinex DM, Tessalon Perles without relief.  He states that he recently was assisting a neighbor and moving animal cages and that it was a very dusty environment.  He states that his symptoms have worsened and he is unable to lie flat with coughing worse especially at night.  Endorses associated pleuritic chest pain and exertional shortness of breath.  Denies abdominal pain, nausea, vomiting, diarrhea, headache, fever or other systemic symptoms.  Patient arrives satting 80% on room air and tachycardic in the upper 120s.  Does Not wear oxygen at home.   Past Medical History Past Medical History:  Diagnosis Date   Asthma    seasonal   Depression    Patient Active Problem List   Diagnosis Date Noted   Sepsis due to pneumonia ([name]) 11/24/2022   Acute respiratory failure with hypoxia ([name]) 11/24/2022   Depression 11/24/2022   AKI (acute kidney injury) ([name]) 04/05/2018   Dehydration 04/05/2018   Leukocytosis 04/05/2018   Hypokalemia 04/05/2018   Metabolic acidosis, increased anion gap 04/05/2018   Transaminitis 04/05/2018   Obesity 04/05/2018   Tobacco use 04/05/2018   Home Medication(s) Prior to Admission medications   Medication Sig Start Date End Date Taking? Authorizing Provider  [facility] ALLERGY 180 MG tablet Take 180 mg by mouth daily.   Yes [provider]  chlorpheniramine-HYDROcodone (TUSSIONEX) 10-8 MG/5ML Take 5 mLs by mouth 2 (two) times daily.   Yes [provider]  hydrochlorothiazide  (MICROZIDE) 12.5 MG capsule Take 12.5 mg by mouth daily.   Yes [provider]  MUCINEX FAST-MAX 10-650-400 MG/20ML LIQD Take 10 mLs by mouth every 6 (six) hours as needed (for coughing).   Yes [provider]  ondansetron (ZOFRAN-ODT) 4 MG disintegrating tablet Take 4 mg by mouth every 8 (eight) hours as needed for nausea or vomiting (dissolve orally).   Yes [provider]  VENTOLIN HFA 108 (90 Base) MCG/ACT inhaler Inhale 2 puffs into the lungs every 4 (four) hours as needed for wheezing or shortness of breath.   Yes [provider]                                                                                                                                    Past Surgical History Past Surgical History:  Procedure Laterality Date   TYMPANOSTOMY TUBE PLACEMENT     WISDOM TOOTH EXTRACTION     Family History Family History  Problem Relation Age of Onset  Hypertension Mother     Social History Social History   Tobacco Use   Smoking status: Some Days    Types: Cigarettes   Smokeless tobacco: Never  Vaping Use   Vaping Use: Never used  Substance Use Topics   Alcohol use: No   Drug use: No   Allergies Patient has no known allergies.  Review of Systems Review of Systems  Respiratory:  Positive for cough, chest tightness and shortness of breath.   Cardiovascular:  Positive for chest pain.    Physical Exam Vital Signs  I have reviewed the triage vital signs BP (!) 156/94 (BP Location: Right Arm)   Pulse (!) 123   Temp 98.5 F (36.9 C) (Oral)   Resp (!) 22   Ht 5\' 8"  (1.727 m)   Wt 113.5 kg   SpO2 99%   BMI 38.04 kg/m   Physical Exam Constitutional:      General: He is not in acute distress.    Appearance: Normal appearance.  HENT:     Head: Normocephalic and atraumatic.     Nose: No congestion or rhinorrhea.  Eyes:     General:        Right eye: No discharge.        Left eye: No discharge.     Extraocular Movements:  Extraocular movements intact.     Pupils: Pupils are equal, round, and reactive to light.  Cardiovascular:     Rate and Rhythm: Regular rhythm. Tachycardia present.     Heart sounds: No murmur heard. Pulmonary:     Effort: No respiratory distress.     Breath sounds: Wheezing present. No rales.  Abdominal:     General: There is no distension.     Tenderness: There is no abdominal tenderness.  Musculoskeletal:        General: Normal range of motion.     Cervical back: Normal range of motion.  Skin:    General: Skin is warm and dry.  Neurological:     General: No focal deficit present.     Mental Status: He is alert.     ED Results and Treatments Labs (all labs ordered are listed, but only abnormal results are displayed) Labs Reviewed  COMPREHENSIVE METABOLIC PANEL - Abnormal; Notable for the following components:      Result Value   Sodium 131 (*)    Potassium 3.2 (*)    Chloride 91 (*)    Glucose, Bld 103 (*)    Total Protein 9.2 (*)    Albumin 3.4 (*)    All other components within normal limits  CBC WITH DIFFERENTIAL/PLATELET - Abnormal; Notable for the following components:   WBC 16.1 (*)    HCT 38.6 (*)    Neutro Abs 10.5 (*)    Monocytes Absolute 1.2 (*)    Abs Immature Granulocytes 0.58 (*)    All other components within normal limits  D-DIMER, QUANTITATIVE - Abnormal; Notable for the following components:   D-Dimer, Quant 1.08 (*)    All other components within normal limits  BLOOD GAS, VENOUS - Abnormal; Notable for the following components:   pH, Ven 7.49 (*)    pCO2, Ven 41 (*)    Bicarbonate 31.2 (*)    Acid-Base Excess 7.2 (*)    All other components within normal limits  LACTIC ACID, PLASMA - Abnormal; Notable for the following components:   Lactic Acid, Venous 2.5 (*)    All other components within normal limits  RESP PANEL  BY RT-PCR (RSV, FLU A&B, COVID)  RVPGX2  RESPIRATORY PANEL BY PCR  BRAIN NATRIURETIC PEPTIDE  PROCALCITONIN  HIV ANTIBODY  (ROUTINE TESTING W REFLEX)  PROTIME-INR  CORTISOL-AM, BLOOD  PROCALCITONIN  CBC  BASIC METABOLIC PANEL  TROPONIN I (HIGH SENSITIVITY)  TROPONIN I (HIGH SENSITIVITY)                                                                                                                          Radiology CT Angio Chest PE W and/or Wo Contrast  Result Date: 11/24/2022 CLINICAL DATA:  Pulmonary embolism (PE) suspected, high prob EXAM: CT ANGIOGRAPHY CHEST WITH CONTRAST TECHNIQUE: Multidetector CT imaging of the chest was performed using the standard protocol during bolus administration of intravenous contrast. Multiplanar CT image reconstructions and MIPs were obtained to evaluate the vascular anatomy. RADIATION DOSE REDUCTION: This exam was performed according to the departmental dose-optimization program which includes automated exposure control, adjustment of the mA and/or kV according to patient size and/or use of iterative reconstruction technique. CONTRAST:  OMNIPAQUE IOHEXOL 350 MG/ML SOLN COMPARISON:  Chest x-ray 11/24/2022 FINDINGS: Cardiovascular: Satisfactory opacification of the pulmonary arteries to the segmental level. No evidence of pulmonary embolism. Thoracic aorta is normal in course and caliber. Normal heart size. No pericardial effusion. Mediastinum/Nodes: Mildly enlarged right paratracheal lymph node measuring 11 mm (series 4, image 41). Mildly enlarged AP window node measuring 10 mm (series 4, image 48). 10 mm right hilar node (series 4, image 54). No enlarged axillary or left hilar lymph nodes. Thyroid gland, trachea, and esophagus are within normal limits. Lungs/Pleura: Diffuse interstitial and alveolar opacities bilaterally, which are predominantly ground-glass. Airspace disease is most confluent within the bilateral upper lobes and within the right middle lobe. There is a small amount of atelectasis within the lingula. No pleural effusion or pneumothorax. Upper Abdomen: No acute  abnormality. Musculoskeletal: Prominent right-sided gynecomastia. No acute or significant osseous abnormality. Review of the MIP images confirms the above findings. IMPRESSION: 1. No evidence of pulmonary embolism. 2. Diffuse bilateral predominantly ground-glass airspace opacities. Findings are favored to represent multifocal pneumonia although pulmonary edema is also a consideration. 3. Mildly enlarged mediastinal and right hilar lymph nodes, likely reactive. 4. Prominent right-sided gynecomastia. Electronically Signed   By: Duanne Guess D.O.   On: 11/24/2022 14:34   DG Chest 2 View  Result Date: 11/24/2022 CLINICAL DATA:  Cough worsening over the last month EXAM: CHEST - 2 VIEW COMPARISON:  X-ray 11/14/2022 FINDINGS: No pneumothorax or effusion. Normal cardiopericardial silhouette. Subtle hazy bilateral ill-defined parenchymal opacities are identified. A subtle infiltrate is possible. Possibilities include surgical clips in the upper abdomen. Atypical or viral process. IMPRESSION: Subtle bilateral patchy ill-defined opacity suggested compared to the prior examination. An infiltrative process is possible. Recommend follow-up Electronically Signed   By: Karen Kays M.D.   On: 11/24/2022 11:20    Pertinent labs & imaging results that were available during my care of the patient were reviewed by me  and considered in my medical decision making (see MDM for details).  Medications Ordered in ED Medications  methocarbamol (ROBAXIN) tablet 750 mg (has no administration in time range)  lactated ringers infusion (150 mL/hr Intravenous Infusion Verify 11/24/22 1800)  enoxaparin (LOVENOX) injection 60 mg (has no administration in time range)  cefTRIAXone (ROCEPHIN) 2 g in sodium chloride 0.9 % 100 mL IVPB (has no administration in time range)  acetaminophen (TYLENOL) tablet 650 mg (has no administration in time range)    Or  acetaminophen (TYLENOL) suppository 650 mg (has no administration in time range)   ondansetron (ZOFRAN) tablet 4 mg (has no administration in time range)    Or  ondansetron (ZOFRAN) injection 4 mg (has no administration in time range)  doxycycline (VIBRAMYCIN) 100 mg in sodium chloride 0.9 % 250 mL IVPB (has no administration in time range)  lactated ringers infusion (has no administration in time range)  ipratropium-albuterol (DUONEB) 0.5-2.5 (3) MG/3ML nebulizer solution 9 mL (9 mLs Nebulization Given 11/24/22 1154)  methylPREDNISolone sodium succinate (SOLU-MEDROL) 125 mg/2 mL injection 125 mg (125 mg Intravenous Given 11/24/22 1151)  albuterol (PROVENTIL) (2.5 MG/3ML) 0.083% nebulizer solution 10 mg (10 mg Nebulization Given 11/24/22 1331)  iohexol (OMNIPAQUE) 350 MG/ML injection 100 mL (100 mLs Intravenous Contrast Given 11/24/22 1406)  ketorolac (TORADOL) 15 MG/ML injection 15 mg (15 mg Intravenous Given 11/24/22 1515)  cefTRIAXone (ROCEPHIN) 2 g in sodium chloride 0.9 % 100 mL IVPB (0 g Intravenous Stopped 11/24/22 1549)  azithromycin (ZITHROMAX) 500 mg in sodium chloride 0.9 % 250 mL IVPB (0 mg Intravenous Stopped 11/24/22 1628)  potassium chloride SA (KLOR-CON M) CR tablet 40 mEq (40 mEq Oral Given 11/24/22 1615)  lactated ringers bolus 1,000 mL (1,000 mLs Intravenous New Bag/Given 11/24/22 1612)  lactated ringers bolus 1,000 mL (1,000 mLs Intravenous New Bag/Given 11/24/22 1832)                                                                                                                                     Procedures .Critical Care  Performed by: Glendora Score, MD Authorized by: Glendora Score, MD   Critical care provider statement:    Critical care time (minutes):  30   Critical care was necessary to treat or prevent imminent or life-threatening deterioration of the following conditions:  Respiratory failure   Critical care was time spent personally by me on the following activities:  Development of treatment plan with patient or surrogate, discussions with consultants,  evaluation of patient's response to treatment, examination of patient, ordering and review of laboratory studies, ordering and review of radiographic studies, ordering and performing treatments and interventions, pulse oximetry, re-evaluation of patient's condition and review of old charts   (including critical care time)  Medical Decision Making / ED Course   This patient presents to the ED for concern of cough, shortness of breath, this involves an extensive number of treatment options, and is  a complaint that carries with it a high risk of complications and morbidity.  The differential diagnosis includes Pe, PTX, Pulmonary Edema, ARDS, COPD/Asthma, ACS, CHF exacerbation, Arrhythmia, Pericardial Effusion/Tamponade, Anemia, Sepsis, Acidosis/Hypercapnia, Anxiety, Viral URI  MDM: Patient seen emergency room for evaluation of cough and shortness of breath.  Physical exam reveals a tachypneic.  Patient that is hypoxic on room air with wheezing bilaterally.  Small amount of accessory muscle use.  Initial laboratory evaluation with a pH of 7.49 with no significant hypercarbia, leukocytosis to 16.1, sodium 131, potassium 3.2, D-dimer elevated at 1.08 and obtained in the setting of hypoxia and tachycardia.  High-sensitivity troponin is normal and BNP is also normal.  Initial chest x-ray with subtle bilateral ill-defined opacities.  Follow-up CT PE obtained in setting of positive D-dimer that shows multifocal disease either infection versus edema.  Patient was given multiple DuoNebs and on reevaluation, work of breathing is improving but wheezing is persistent.  Patient then received a 10 mg continuous albuterol treatment over 1 hour and on second reevaluation, patient continuing to have work of breathing improvement but when attempting to transition the patient off of oxygen, patient dropped his oxygen saturations to 86% on room air.  Patient started on ceftriaxone azithromycin for pneumonia and will require  hospital admission for new oxygen requirement and COPD exacerbation with possible pneumonia   Additional history obtained: -Additional history obtained from wife -External records from outside source obtained and reviewed including: Chart review including previous notes, labs, imaging, consultation notes   Lab Tests: -I ordered, reviewed, and interpreted labs.   The pertinent results include:   Labs Reviewed  COMPREHENSIVE METABOLIC PANEL - Abnormal; Notable for the following components:      Result Value   Sodium 131 (*)    Potassium 3.2 (*)    Chloride 91 (*)    Glucose, Bld 103 (*)    Total Protein 9.2 (*)    Albumin 3.4 (*)    All other components within normal limits  CBC WITH DIFFERENTIAL/PLATELET - Abnormal; Notable for the following components:   WBC 16.1 (*)    HCT 38.6 (*)    Neutro Abs 10.5 (*)    Monocytes Absolute 1.2 (*)    Abs Immature Granulocytes 0.58 (*)    All other components within normal limits  D-DIMER, QUANTITATIVE - Abnormal; Notable for the following components:   D-Dimer, Quant 1.08 (*)    All other components within normal limits  BLOOD GAS, VENOUS - Abnormal; Notable for the following components:   pH, Ven 7.49 (*)    pCO2, Ven 41 (*)    Bicarbonate 31.2 (*)    Acid-Base Excess 7.2 (*)    All other components within normal limits  LACTIC ACID, PLASMA - Abnormal; Notable for the following components:   Lactic Acid, Venous 2.5 (*)    All other components within normal limits  RESP PANEL BY RT-PCR (RSV, FLU A&B, COVID)  RVPGX2  RESPIRATORY PANEL BY PCR  BRAIN NATRIURETIC PEPTIDE  PROCALCITONIN  HIV ANTIBODY (ROUTINE TESTING W REFLEX)  PROTIME-INR  CORTISOL-AM, BLOOD  PROCALCITONIN  CBC  BASIC METABOLIC PANEL  TROPONIN I (HIGH SENSITIVITY)  TROPONIN I (HIGH SENSITIVITY)         Imaging Studies ordered: I ordered imaging studies including chest x-ray, CT PE I independently visualized and interpreted imaging. I agree with the  radiologist interpretation   Medicines ordered and prescription drug management: Meds ordered this encounter  Medications   ipratropium-albuterol (DUONEB) 0.5-2.5 (3) MG/3ML  nebulizer solution 9 mL   methylPREDNISolone sodium succinate (SOLU-MEDROL) 125 mg/2 mL injection 125 mg   DISCONTD: albuterol (PROVENTIL,VENTOLIN) solution continuous neb   DISCONTD: albuterol (PROVENTIL,VENTOLIN) solution continuous neb   albuterol (PROVENTIL) (2.5 MG/3ML) 0.083% nebulizer solution 10 mg   iohexol (OMNIPAQUE) 350 MG/ML injection 100 mL   ketorolac (TORADOL) 15 MG/ML injection 15 mg   cefTRIAXone (ROCEPHIN) 2 g in sodium chloride 0.9 % 100 mL IVPB    Order Specific Question:   Antibiotic Indication:    Answer:   CAP   azithromycin (ZITHROMAX) 500 mg in sodium chloride 0.9 % 250 mL IVPB    Order Specific Question:   Antibiotic Indication:    Answer:   CAP   potassium chloride SA (KLOR-CON M) CR tablet 40 mEq   lactated ringers bolus 1,000 mL   lactated ringers bolus 1,000 mL   DISCONTD: escitalopram (LEXAPRO) tablet 10 mg   methocarbamol (ROBAXIN) tablet 750 mg   lactated ringers infusion   enoxaparin (LOVENOX) injection 60 mg   cefTRIAXone (ROCEPHIN) 2 g in sodium chloride 0.9 % 100 mL IVPB    Order Specific Question:   Antibiotic Indication:    Answer:   CAP   OR Linked Order Group    acetaminophen (TYLENOL) tablet 650 mg    acetaminophen (TYLENOL) suppository 650 mg   OR Linked Order Group    ondansetron (ZOFRAN) tablet 4 mg    ondansetron (ZOFRAN) injection 4 mg   doxycycline (VIBRAMYCIN) 100 mg in sodium chloride 0.9 % 250 mL IVPB    Order Specific Question:   Antibiotic Indication:    Answer:   CAP   lactated ringers infusion    -I have reviewed the patients home medicines and have made adjustments as needed  Critical interventions Multiple DuoNebs, methylprednisolone, multiple antibiotics    Cardiac Monitoring: The patient was maintained on a cardiac monitor.  I personally  viewed and interpreted the cardiac monitored which showed an underlying rhythm of: Sinus tachycardia  Social Determinants of Health:  Factors impacting patients care include: none   Reevaluation: After the interventions noted above, I reevaluated the patient and found that they have :improved  Co morbidities that complicate the patient evaluation  Past Medical History:  Diagnosis Date   Asthma    seasonal   Depression       Dispostion: I considered admission for this patient, and due to new oxygen requirement and COPD exacerbation with possible pneumonia patient require hospital admission     Final Clinical Impression(s) / ED Diagnoses Final diagnoses:  COPD exacerbation ([name])  Multifocal pneumonia     @PCDICTATION @    Glendora Score, MD 11/24/22 1958

## 2022-11-25 ENCOUNTER — Inpatient Hospital Stay (HOSPITAL_COMMUNITY): Payer: Medicaid Other

## 2022-11-25 DIAGNOSIS — J9601 Acute respiratory failure with hypoxia: Secondary | ICD-10-CM | POA: Diagnosis not present

## 2022-11-25 DIAGNOSIS — J189 Pneumonia, unspecified organism: Secondary | ICD-10-CM | POA: Diagnosis not present

## 2022-11-25 DIAGNOSIS — A419 Sepsis, unspecified organism: Secondary | ICD-10-CM | POA: Diagnosis not present

## 2022-11-25 DIAGNOSIS — R000 Tachycardia, unspecified: Secondary | ICD-10-CM | POA: Diagnosis not present

## 2022-11-25 LAB — EXPECTORATED SPUTUM ASSESSMENT W GRAM STAIN, RFLX TO RESP C

## 2022-11-25 LAB — BASIC METABOLIC PANEL
Anion gap: 10 (ref 5–15)
BUN: 12 mg/dL (ref 6–20)
CO2: 25 mmol/L (ref 22–32)
Calcium: 8.5 mg/dL — ABNORMAL LOW (ref 8.9–10.3)
Chloride: 98 mmol/L (ref 98–111)
Creatinine, Ser: 0.71 mg/dL (ref 0.61–1.24)
GFR, Estimated: >60 mL/min (ref >60)
Glucose, Bld: 108 mg/dL — ABNORMAL HIGH (ref 70–99)
Potassium: 4.1 mmol/L (ref 3.5–5.1)
Sodium: 133 mmol/L — ABNORMAL LOW (ref 135–145)

## 2022-11-25 LAB — CBC
HCT: 31.6 % — ABNORMAL LOW (ref 39.0–52.0)
Hemoglobin: 10.4 g/dL — ABNORMAL LOW (ref 13.0–17.0)
MCH: 30 pg (ref 26.0–34.0)
MCHC: 32.9 g/dL (ref 30.0–36.0)
MCV: 91.1 fL (ref 80.0–100.0)
Platelets: 276 10*3/uL (ref 150–400)
RBC: 3.47 MIL/uL — ABNORMAL LOW (ref 4.22–5.81)
RDW: 13.9 % (ref 11.5–15.5)
WBC: 22.3 10*3/uL — ABNORMAL HIGH (ref 4.0–10.5)
nRBC: 0 % (ref 0.0–0.2)

## 2022-11-25 LAB — ECHOCARDIOGRAM COMPLETE
Area-P 1/2: 5.31 cm2
Height: 68 in
S' Lateral: 2.3 cm
Weight: 4003.2 oz

## 2022-11-25 LAB — MRSA NEXT GEN BY PCR, NASAL: MRSA by PCR Next Gen: NOT DETECTED

## 2022-11-25 LAB — PROCALCITONIN: Procalcitonin: <0.1 ng/mL

## 2022-11-25 LAB — SEDIMENTATION RATE: Sed Rate: 102 mm/hr — ABNORMAL HIGH (ref 0–16)

## 2022-11-25 LAB — PROTIME-INR
INR: 1.1 (ref 0.8–1.2)
Prothrombin Time: 13.7 seconds (ref 11.4–15.2)

## 2022-11-25 LAB — CORTISOL-AM, BLOOD: Cortisol - AM: 1.5 ug/dL — ABNORMAL LOW (ref 6.7–22.6)

## 2022-11-25 LAB — BRAIN NATRIURETIC PEPTIDE: B Natriuretic Peptide: 144.1 pg/mL — ABNORMAL HIGH (ref 0.0–100.0)

## 2022-11-25 LAB — STREP PNEUMONIAE URINARY ANTIGEN: Strep Pneumo Urinary Antigen: NEGATIVE

## 2022-11-25 LAB — C-REACTIVE PROTEIN: CRP: 7.2 mg/dL — ABNORMAL HIGH (ref <1.0)

## 2022-11-25 MED ORDER — IPRATROPIUM-ALBUTEROL 0.5-2.5 (3) MG/3ML IN SOLN
3.0000 mL | Freq: Four times a day (QID) | RESPIRATORY_TRACT | Status: DC
  Administered 2022-11-25 – 2022-11-27 (×9): 3 mL via RESPIRATORY_TRACT
  Filled 2022-11-25 (×10): qty 3

## 2022-11-25 MED ORDER — AMLODIPINE BESYLATE 10 MG PO TABS
5.0000 mg | ORAL_TABLET | Freq: Every day | ORAL | Status: DC
  Administered 2022-11-25 – 2022-11-27 (×3): 5 mg via ORAL
  Filled 2022-11-25 (×3): qty 1

## 2022-11-25 MED ORDER — FUROSEMIDE 10 MG/ML IJ SOLN
40.0000 mg | Freq: Once | INTRAMUSCULAR | Status: AC
  Administered 2022-11-25: 40 mg via INTRAVENOUS
  Filled 2022-11-25: qty 4

## 2022-11-25 MED ORDER — ALBUTEROL SULFATE (2.5 MG/3ML) 0.083% IN NEBU
2.5000 mg | INHALATION_SOLUTION | RESPIRATORY_TRACT | Status: DC | PRN
  Administered 2022-11-25: 2.5 mg via RESPIRATORY_TRACT
  Filled 2022-11-25 (×2): qty 3

## 2022-11-25 NOTE — TOC CM/SW Note (Signed)
  Transition of Care [facility]) Screening Note   Patient Details  Name: Baylen Buckner Date of Birth: January 12, 1986   [facility]) CM/SW Contact:    Howell Rucks, RN Phone Number: 11/25/2022, 8:32 AM    Transition of Care Department [facility]) has reviewed patient and no TOC needs have been identified at this time. We will continue to monitor patient advancement through interdisciplinary progression rounds. If new patient transition needs arise, please place a TOC consult.

## 2022-11-25 NOTE — Progress Notes (Signed)
MEWS Progress Note  Patient Details Name: Leroy Lindsey MRN: [id number] DOB: February 21, 1986 Today's Date: 11/25/2022   MEWS Flowsheet Documentation:  Assess: MEWS Score Temp: 97.8 F (36.6 C) BP: (!) 155/88 MAP (mmHg): 108 Pulse Rate: (!) 116 ECG Heart Rate: (!) 110 Resp: 16 Level of Consciousness: Alert SpO2: 99 % O2 Device: Nasal Cannula O2 Flow Rate (L/min): 2 L/min Assess: MEWS Score MEWS Temp: 0 MEWS Systolic: 0 MEWS Pulse: 2 MEWS RR: 0 MEWS LOC: 0 MEWS Score: 2 MEWS Score Color: Yellow Assess: SIRS CRITERIA SIRS Temperature : 0 SIRS Respirations : 0 SIRS Pulse: 1 SIRS WBC: 0 SIRS Score Sum : 1 SIRS Temperature : 0 SIRS Pulse: 1 SIRS Respirations : 0 SIRS WBC: 1 SIRS Score Sum : 2 Assess: if the MEWS score is Yellow or Red Were vital signs taken at a resting state?: Yes Focused Assessment: No change from prior assessment Does the patient meet 2 or more of the SIRS criteria?: Yes Does the patient have a confirmed or suspected source of infection?: Yes MEWS guidelines implemented : No, previously yellow, continue vital signs every 4 hours Notify: Charge Nurse/RN Name of Charge Nurse/RN Notified: Engineer, drilling Provider Notification Provider Name/Title: Dr Lowell Guitar Date Provider Notified: 11/25/22 Time Provider Notified: 1320 Method of Notification: Call Notification Reason: Other (Comment) (he called to discuss patient status) Test performed and critical result: Lactic 2.5 Date Critical Result Received: 11/24/22 Time Critical Result Received: 1704 Provider response: See new orders Notify: Rapid Response Name of Rapid Response RN Notified: Enid Derry RN Date Rapid Response Notified: 11/24/22 Time Rapid Response Notified: 1816  Dr Lowell Guitar has added orders for urine strep and legionella.  Will continue to monitor.  Echo just completed.      Mauricia Area 11/25/2022, 2:42 PM

## 2022-11-25 NOTE — Progress Notes (Addendum)
PROGRESS NOTE    Leroy Lindsey  ZOX:[id number] DOB: 02/25/1986 DOA: 11/24/2022 PCP: Patient, No Pcp Per  Chief Complaint  Patient presents with   Cough    Brief Narrative:    Leroy Lindsey is Leroy Lindsey 37 y.o. male with medical history significant for asthma, depression, morbid obesity (BMI 39) who presents to the emergency room for evaluation of refractory cough. Patient said his symptoms started Leroy Lindsey month ago with Leroy Lindsey cough that has been productive of yellow phlegm associated with chills and occasional fever.  He was seen at the hospital in [place] and was diagnosed with acute bronchitis and discharged home on Leroy Schloesser course of steroids and Leroy Lindsey [name] which he completed without any improvement in his symptoms.  He went back to the hospital because he did not feel well and was told he most likely had an acute viral respiratory illness that required supportive care. Over the last 2 weeks his mother states that he he has been unable to sleep laying flat due to this refractory cough and he now has shortness of breath with exertion.  Patient states the cough is not productive of yellow phlegm and he usually gets chills and fevers at night as well as night sweats.  He also complains of pleuritic chest pain.  Due to the persistence of his symptoms he was brought into the ER for further evaluation. He denies having any changes in his bowel habits, no nausea, no vomiting, no dizziness, no lightheadedness, no urinary symptoms, no blurred vision, no leg swelling, no focal deficit.  Vital signs upon arrival to the ER, Tmax 99, tachycardia with heart rate in the 130s, tachypnea with respiratory rate in the 30s and room air pulse oximetry of 86% requiring oxygen supplementation at 2 L to maintain pulse oximetry of 91% Abnormal labs include marked leukocytosis, D-dimer 1.08, potassium of 3.2 Chest x-ray reviewed by me shows subtle bilateral patchy ill-defined opacity suggested compared to the prior examination. An infiltrative  process is possible. CT angiogram of the chest showed no evidence of pulmonary embolism. Diffuse bilateral predominantly ground-glass airspace opacities. Findings are favored to represent multifocal pneumonia although pulmonary edema is also [name] consideration. Mildly enlarged mediastinal and right hilar lymph nodes, likely reactive. Prominent right-sided gynecomastia.    Assessment & Plan:   Principal Problem:   Sepsis due to pneumonia [name]) Active Problems:   Acute respiratory failure with hypoxia ([name])   Hypokalemia   Obesity   Depression  Acute Hypoxic Respiratory Failure  Shortness of Breath  Seems to be subacute process, worsening over the past month Was diagnosed with enterovirus 4/21 (per my discussion with [name]) -> possible superinfection?  Doesn't appear he was sent home with any abx or steroids per my discussion.  Will request records to review. CT with diffuse bilateral predominantly ground glass airspace opacities (multifocal pneumonia vs pulm edema) Negative RVP Negative covid, flu, RSV MRSA PCR pending Urine strep, urine legionella pending Sputum cx pending Continue ceftriaxone/doxycycline for now Negative HIV No blood cultures collected at presentation, he's on abx, will hold off unless febrile Significant smoking hx, probably would benefit from PFT's outside of acute illness  Elevated BNP Not obviously volume overloaded on exam Given dose of lasix, didn't note significant improvement Will monitor for now and follow echo  Tobacco Abuse 22 pack year smoking hx Encourage cessation  Hypertension Currently holding thiazide Start amlodipine, uptitrate meds as needed  Tachycardia Due to above Follow EKG   Low Cortisol I think this is  suppressed in setting of IV steroids, can be followed up later as outpatient  Gynecomastia  Needs additional w/u outpatient     DVT prophylaxis: lovenox Code Status: full Family Communication:  none Disposition:   Status is: Inpatient Remains inpatient appropriate because: need for continued supplemental O2   Consultants:  none  Procedures:  none  Antimicrobials:  Anti-infectives (From admission, onward)    Start     Dose/Rate Route Frequency Ordered Stop   11/25/22 1000  cefTRIAXone (ROCEPHIN) 2 g in sodium chloride 0.9 % 100 mL IVPB        2 g 200 mL/hr over 30 Minutes Intravenous Every 24 hours 11/24/22 1548 11/30/22 0959   11/24/22 1800  doxycycline (VIBRAMYCIN) 100 mg in sodium chloride 0.9 % 250 mL IVPB        100 mg 125 mL/hr over 120 Minutes Intravenous 2 times daily 11/24/22 1548     11/24/22 1515  cefTRIAXone (ROCEPHIN) 2 g in sodium chloride 0.9 % 100 mL IVPB        2 g 200 mL/hr over 30 Minutes Intravenous  Once 11/24/22 1504 11/24/22 1549   11/24/22 1515  azithromycin (ZITHROMAX) 500 mg in sodium chloride 0.9 % 250 mL IVPB        500 mg 250 mL/hr over 60 Minutes Intravenous  Once 11/24/22 1504 11/24/22 1628       Subjective: SOB Leroy Lindsey little improved   Objective: Vitals:   11/25/22 0547 11/25/22 0800 11/25/22 1015 11/25/22 1323  BP: (!) 146/88  (!) 160/101 (!) 155/88  Pulse: (!) 107  (!) 118 (!) 116  Resp: (!) 26 (!) 23 18 16   Temp: 98.1 F (36.7 C)  98.4 F (36.9 C) 97.8 F (36.6 C)  TempSrc: Oral  Oral Oral  SpO2: 98%  98% 96%  Weight:      Height:        Intake/Output Summary (Last 24 hours) at 11/25/2022 1405 Last data filed at 11/25/2022 0930 Gross per 24 hour  Intake 1255.97 ml  Output --  Net 1255.97 ml   Filed Weights   11/24/22 1053 11/24/22 1756  Weight: 117.9 kg 113.5 kg    Examination:  General exam: Appears calm and comfortable  Respiratory system: Clear to auscultation. Respiratory effort normal.  On 2-3 L [place] Cardiovascular system: S1 & S2 heard, RRR.  Gastrointestinal system: Abdomen is nondistended, soft and nontender.  Central nervous system: Alert and oriented. No focal neurological deficits. Extremities: no  LEE   Data Reviewed: I have personally reviewed following labs and imaging studies  CBC: Recent Labs  Lab 11/24/22 1150 11/25/22 0423  WBC 16.1* 22.3*  NEUTROABS 10.5*  --   HGB 13.0 10.4*  HCT 38.6* 31.6*  MCV 90.4 91.1  PLT 319 276    Basic Metabolic Panel: Recent Labs  Lab 11/24/22 1150 11/25/22 0423  NA 131* 133*  K 3.2* 4.1  CL 91* 98  CO2 27 25  GLUCOSE 103* 108*  BUN 11 12  CREATININE 0.92 0.71  CALCIUM 8.9 8.5*    GFR: Estimated Creatinine Clearance: 154.5 mL/min (by C-G formula based on SCr of 0.71 mg/dL).  Liver Function Tests: Recent Labs  Lab 11/24/22 1150  AST 25  ALT 35  ALKPHOS 103  BILITOT 0.8  PROT 9.2*  ALBUMIN 3.4*    CBG: No results for input(s): "GLUCAP" in the last 168 hours.   Recent Results (from the past 240 hour(s))  Resp panel by RT-PCR (RSV, Flu Leroy Lindsey&B, Covid) Anterior  Nasal Swab     Status: None   Collection Time: 11/24/22  3:26 PM   Specimen: Anterior Nasal Swab  Result Value Ref Range Status   SARS Coronavirus 2 by RT PCR NEGATIVE NEGATIVE Final    Comment: (NOTE) SARS-CoV-2 target nucleic acids are NOT DETECTED.  The SARS-CoV-2 RNA is generally detectable in upper respiratory specimens during the acute phase of infection. The lowest concentration of SARS-CoV-2 viral copies this assay can detect is 138 copies/mL. Leroy Lindsey negative result does not preclude SARS-Cov-2 infection and should not be used as the sole basis for treatment or other patient management decisions. Leroy Lindsey negative result may occur with  improper specimen collection/handling, submission of specimen other than nasopharyngeal swab, presence of viral mutation(s) within the areas targeted by this assay, and inadequate number of viral copies(<138 copies/mL). Leroy Lindsey negative result must be combined with clinical observations, patient history, and epidemiological information. The expected result is Negative.  Fact Sheet for Patients:   BloggerCourse.com  Fact Sheet for Healthcare Providers:  SeriousBroker.it  This test is no t yet approved or cleared by the [place] FDA and  has been authorized for detection and/or diagnosis of SARS-CoV-2 by FDA under an Emergency Use Authorization (EUA). This EUA will remain  in effect (meaning this test can be used) for the duration of the COVID-19 declaration under Section 564(b)(1) of the Act, 21 U.S.C.section 360bbb-3(b)(1), unless the authorization is terminated  or revoked sooner.       Influenza Leroy Lindsey by PCR NEGATIVE NEGATIVE Final   Influenza B by PCR NEGATIVE NEGATIVE Final    Comment: (NOTE) The Xpert Xpress SARS-CoV-2/FLU/RSV plus assay is intended as an aid in the diagnosis of influenza from Nasopharyngeal swab specimens and should not be used as Leroy Lindsey sole basis for treatment. Nasal washings and aspirates are unacceptable for Xpert Xpress SARS-CoV-2/FLU/RSV testing.  Fact Sheet for Patients: BloggerCourse.com  Fact Sheet for Healthcare Providers: SeriousBroker.it  This test is not yet approved or cleared by the [place] FDA and has been authorized for detection and/or diagnosis of SARS-CoV-2 by FDA under an Emergency Use Authorization (EUA). This EUA will remain in effect (meaning this test can be used) for the duration of the COVID-19 declaration under Section 564(b)(1) of the Act, 21 U.S.C. section 360bbb-3(b)(1), unless the authorization is terminated or revoked.     Resp Syncytial Virus by PCR NEGATIVE NEGATIVE Final    Comment: (NOTE) Fact Sheet for Patients: BloggerCourse.com  Fact Sheet for Healthcare Providers: SeriousBroker.it  This test is not yet approved or cleared by the [place] FDA and has been authorized for detection and/or diagnosis of SARS-CoV-2 by FDA under an Emergency Use  Authorization (EUA). This EUA will remain in effect (meaning this test can be used) for the duration of the COVID-19 declaration under Section 564(b)(1) of the Act, 21 U.S.C. section 360bbb-3(b)(1), unless the authorization is terminated or revoked.  Performed at [facility], [address], [place], [place] [address]   Respiratory (~20 pathogens) panel by PCR     Status: None   Collection Time: 11/24/22  4:08 PM   Specimen: Nasopharyngeal Swab; Respiratory  Result Value Ref Range Status   Adenovirus NOT DETECTED NOT DETECTED Final   Coronavirus 229E NOT DETECTED NOT DETECTED Final    Comment: (NOTE) The Coronavirus on the Respiratory Panel, DOES NOT test for the novel  Coronavirus (2019 nCoV)    Coronavirus HKU1 NOT DETECTED NOT DETECTED Final   Coronavirus NL63 NOT DETECTED NOT DETECTED  Final   Coronavirus OC43 NOT DETECTED NOT DETECTED Final   Metapneumovirus NOT DETECTED NOT DETECTED Final   Rhinovirus / Enterovirus NOT DETECTED NOT DETECTED Final   Influenza Leroy Lindsey NOT DETECTED NOT DETECTED Final   Influenza B NOT DETECTED NOT DETECTED Final   Parainfluenza Virus 1 NOT DETECTED NOT DETECTED Final   Parainfluenza Virus 2 NOT DETECTED NOT DETECTED Final   Parainfluenza Virus 3 NOT DETECTED NOT DETECTED Final   Parainfluenza Virus 4 NOT DETECTED NOT DETECTED Final   Respiratory Syncytial Virus NOT DETECTED NOT DETECTED Final   Bordetella pertussis NOT DETECTED NOT DETECTED Final   Bordetella Parapertussis NOT DETECTED NOT DETECTED Final   Chlamydophila pneumoniae NOT DETECTED NOT DETECTED Final   Mycoplasma pneumoniae NOT DETECTED NOT DETECTED Final    Comment: Performed at [facility], [address], [place], [place] [address]         Radiology Studies: CT Angio Chest PE W and/or Wo Contrast  Result Date: 11/24/2022 CLINICAL DATA:  Pulmonary embolism (PE) suspected, high prob EXAM: CT ANGIOGRAPHY CHEST WITH CONTRAST TECHNIQUE: Multidetector CT  imaging of the chest was performed using the standard protocol during bolus administration of intravenous contrast. Multiplanar CT image reconstructions and MIPs were obtained to evaluate the vascular anatomy. RADIATION DOSE REDUCTION: This exam was performed according to the departmental dose-optimization program which includes automated exposure control, adjustment of the mA and/or kV according to patient size and/or use of iterative reconstruction technique. CONTRAST:  OMNIPAQUE IOHEXOL 350 MG/ML SOLN COMPARISON:  Chest x-ray 11/24/2022 FINDINGS: Cardiovascular: Satisfactory opacification of the pulmonary arteries to the segmental level. No evidence of pulmonary embolism. Thoracic aorta is normal in course and caliber. Normal heart size. No pericardial effusion. Mediastinum/Nodes: Mildly enlarged right paratracheal lymph node measuring 11 mm (series 4, image 41). Mildly enlarged AP window node measuring 10 mm (series 4, image 48). 10 mm right hilar node (series 4, image 54). No enlarged axillary or left hilar lymph nodes. Thyroid gland, trachea, and esophagus are within normal limits. Lungs/Pleura: Diffuse interstitial and alveolar opacities bilaterally, which are predominantly ground-glass. Airspace disease is most confluent within the bilateral upper lobes and within the right middle lobe. There is Leroy Lindsey small amount of atelectasis within the lingula. No pleural effusion or pneumothorax. Upper Abdomen: No acute abnormality. Musculoskeletal: Prominent right-sided gynecomastia. No acute or significant osseous abnormality. Review of the MIP images confirms the above findings. IMPRESSION: 1. No evidence of pulmonary embolism. 2. Diffuse bilateral predominantly ground-glass airspace opacities. Findings are favored to represent multifocal pneumonia although pulmonary edema is also Leroy Sudano consideration. 3. Mildly enlarged mediastinal and right hilar lymph nodes, likely reactive. 4. Prominent right-sided gynecomastia.  Electronically Signed   By: Duanne Guess D.O.   On: 11/24/2022 14:34   DG Chest 2 View  Result Date: 11/24/2022 CLINICAL DATA:  Cough worsening over the last month EXAM: CHEST - 2 VIEW COMPARISON:  X-ray 11/14/2022 FINDINGS: No pneumothorax or effusion. Normal cardiopericardial silhouette. Subtle hazy bilateral ill-defined parenchymal opacities are identified. Leroy Lindsey subtle infiltrate is possible. Possibilities include surgical clips in the upper abdomen. Atypical or viral process. IMPRESSION: Subtle bilateral patchy ill-defined opacity suggested compared to the prior examination. An infiltrative process is possible. Recommend follow-up Electronically Signed   By: Karen Kays M.D.   On: 11/24/2022 11:20        Scheduled Meds:  enoxaparin (LOVENOX) injection  60 mg Subcutaneous Daily   ipratropium-albuterol  3 mL Nebulization Q6H WA   Continuous Infusions:  cefTRIAXone (  ROCEPHIN)  IV 2 g (11/25/22 0905)   doxycycline (VIBRAMYCIN) IV 100 mg (11/25/22 1054)     LOS: 1 day    Time spent: over 30 min    Lacretia Nicks, MD [facility]   To contact the attending provider between 7A-7P or the covering provider during after hours 7P-7A, please log into the web site [email or web address] and access using universal [place] password for that web site. If you do not have the password, please call the hospital operator.  11/25/2022, 2:05 PM

## 2022-11-26 DIAGNOSIS — J189 Pneumonia, unspecified organism: Secondary | ICD-10-CM | POA: Diagnosis not present

## 2022-11-26 DIAGNOSIS — A419 Sepsis, unspecified organism: Secondary | ICD-10-CM | POA: Diagnosis not present

## 2022-11-26 LAB — COMPREHENSIVE METABOLIC PANEL
ALT: 27 U/L (ref 0–44)
AST: 18 U/L (ref 15–41)
Albumin: 3 g/dL — ABNORMAL LOW (ref 3.5–5.0)
Alkaline Phosphatase: 82 U/L (ref 38–126)
Anion gap: 9 (ref 5–15)
BUN: 9 mg/dL (ref 6–20)
CO2: 25 mmol/L (ref 22–32)
Calcium: 8.5 mg/dL — ABNORMAL LOW (ref 8.9–10.3)
Chloride: 98 mmol/L (ref 98–111)
Creatinine, Ser: 0.72 mg/dL (ref 0.61–1.24)
GFR, Estimated: >60 mL/min (ref >60)
Glucose, Bld: 88 mg/dL (ref 70–99)
Potassium: 3.4 mmol/L — ABNORMAL LOW (ref 3.5–5.1)
Sodium: 132 mmol/L — ABNORMAL LOW (ref 135–145)
Total Bilirubin: 0.5 mg/dL (ref 0.3–1.2)
Total Protein: 7.6 g/dL (ref 6.5–8.1)

## 2022-11-26 LAB — CBC WITH DIFFERENTIAL/PLATELET
Abs Immature Granulocytes: 0.13 10*3/uL — ABNORMAL HIGH (ref 0.00–0.07)
Basophils Absolute: 0 10*3/uL (ref 0.0–0.1)
Basophils Relative: 0 %
Eosinophils Absolute: 0.1 10*3/uL (ref 0.0–0.5)
Eosinophils Relative: 1 %
HCT: 34.9 % — ABNORMAL LOW (ref 39.0–52.0)
Hemoglobin: 11.3 g/dL — ABNORMAL LOW (ref 13.0–17.0)
Immature Granulocytes: 1 %
Lymphocytes Relative: 39 %
Lymphs Abs: 5.7 10*3/uL — ABNORMAL HIGH (ref 0.7–4.0)
MCH: 29.5 pg (ref 26.0–34.0)
MCHC: 32.4 g/dL (ref 30.0–36.0)
MCV: 91.1 fL (ref 80.0–100.0)
Monocytes Absolute: 1.3 10*3/uL — ABNORMAL HIGH (ref 0.1–1.0)
Monocytes Relative: 9 %
Neutro Abs: 7.4 10*3/uL (ref 1.7–7.7)
Neutrophils Relative %: 50 %
Platelets: 300 10*3/uL (ref 150–400)
RBC: 3.83 MIL/uL — ABNORMAL LOW (ref 4.22–5.81)
RDW: 13.9 % (ref 11.5–15.5)
WBC: 14.6 10*3/uL — ABNORMAL HIGH (ref 4.0–10.5)
nRBC: 0 % (ref 0.0–0.2)

## 2022-11-26 LAB — BRAIN NATRIURETIC PEPTIDE: B Natriuretic Peptide: 49.2 pg/mL (ref 0.0–100.0)

## 2022-11-26 LAB — MAGNESIUM: Magnesium: 1.9 mg/dL (ref 1.7–2.4)

## 2022-11-26 LAB — PHOSPHORUS: Phosphorus: 3.9 mg/dL (ref 2.5–4.6)

## 2022-11-26 MED ORDER — HYDROCHLOROTHIAZIDE 12.5 MG PO TABS
12.5000 mg | ORAL_TABLET | Freq: Every day | ORAL | Status: DC
  Administered 2022-11-26 – 2022-11-27 (×2): 12.5 mg via ORAL
  Filled 2022-11-26 (×2): qty 1

## 2022-11-26 MED ORDER — POTASSIUM CHLORIDE CRYS ER 20 MEQ PO TBCR
20.0000 meq | EXTENDED_RELEASE_TABLET | Freq: Every day | ORAL | Status: DC
  Administered 2022-11-27: 20 meq via ORAL
  Filled 2022-11-26: qty 1

## 2022-11-26 MED ORDER — POTASSIUM CHLORIDE CRYS ER 20 MEQ PO TBCR
40.0000 meq | EXTENDED_RELEASE_TABLET | ORAL | Status: AC
  Administered 2022-11-26 (×2): 40 meq via ORAL
  Filled 2022-11-26 (×2): qty 2

## 2022-11-26 NOTE — Progress Notes (Signed)
PROGRESS NOTE    [name]  ATF:[id number] DOB: [birth year] DOA: 11/24/2022 PCP: Patient, No Pcp Per  Chief Complaint  Patient presents with   Cough    Brief Narrative:    Leroy Lindsey is Leroy Lindsey 37 y.o. male with medical history significant for asthma, depression, morbid obesity (BMI 39) who presents to the emergency room for evaluation of refractory cough. Patient said his symptoms started Leroy Lindsey month ago with Leroy Lindsey cough that has been productive of yellow phlegm associated with chills and occasional fever.  He was seen at the hospital in [place] and was diagnosed with acute bronchitis and discharged home on Cortni Tays course of steroids and Earnestine Shipp [name] which he completed without any improvement in his symptoms.  He went back to the hospital because he did not feel well and was told he most likely had an acute viral respiratory illness that required supportive care. Over the last 2 weeks his mother states that he he has been unable to sleep laying flat due to this refractory cough and he now has shortness of breath with exertion.  Patient states the cough is not productive of yellow phlegm and he usually gets chills and fevers at night as well as night sweats.  He also complains of pleuritic chest pain.  Due to the persistence of his symptoms he was brought into the ER for further evaluation. He denies having any changes in his bowel habits, no nausea, no vomiting, no dizziness, no lightheadedness, no urinary symptoms, no blurred vision, no leg swelling, no focal deficit.  Vital signs upon arrival to the ER, Tmax 99, tachycardia with heart rate in the 130s, tachypnea with respiratory rate in the 30s and room air pulse oximetry of 86% requiring oxygen supplementation at 2 L to maintain pulse oximetry of 91% Abnormal labs include marked leukocytosis, D-dimer 1.08, potassium of 3.2 Chest x-ray reviewed by me shows subtle bilateral patchy ill-defined opacity suggested compared to the prior examination. An infiltrative  process is possible. CT angiogram of the chest showed no evidence of pulmonary embolism. Diffuse bilateral predominantly ground-glass airspace opacities. Findings are favored to represent multifocal pneumonia although pulmonary edema is also Leroy Lindsey consideration. Mildly enlarged mediastinal and right hilar lymph nodes, likely reactive. Prominent right-sided gynecomastia.    Assessment & Plan:   Principal Problem:   Sepsis due to pneumonia [facility]) Active Problems:   Acute respiratory failure with hypoxia ([name])   Hypokalemia   Obesity   Depression  Inappropriate Sinus Tachycardia  Rates as high as 130's today Sinus tachycardia Echo with 65-70%, no regional wall motion abnormalities, moderate LVH TSH Monitor another 24 hrs, no PE on CT scan, suspect all due to resp failure/pneumonia   Acute Hypoxic Respiratory Failure  Shortness of Breath  Seems to be subacute process, worsening over the past month Was diagnosed with enterovirus 4/21 (per my discussion with [facility] ED staff) -> possible superinfection?  Doesn't appear he was sent home with any abx or steroids per my discussion.  Will request records to review. CT with diffuse bilateral predominantly ground glass airspace opacities (multifocal pneumonia vs pulm edema) Negative RVP Negative covid, flu, RSV MRSA PCR negative Urine strep negative, urine legionella pending Sputum cx pending Continue ceftriaxone/doxycycline for now Negative HIV No blood cultures collected at presentation, he's on abx, will hold off unless febrile Significant smoking hx, probably would benefit from PFT's outside of acute illness He should see pulmonology after this hospital stay  Elevated BNP Not obviously volume overloaded on exam Given dose  of lasix, didn't note significant improvement Will monitor for now and follow echo (normal EF)  Tobacco Abuse 22 pack year smoking hx Encourage cessation  Hypertension Currently holding thiazide Start  amlodipine, thiazide - consider beta blocker, but will hold off for now, try to treat underlying cause - uptitrate meds as needed  Low Cortisol I think this is suppressed in setting of IV steroids, can be followed up later as outpatient  Gynecomastia  Needs additional w/u outpatient    DVT prophylaxis: lovenox Code Status: full Family Communication: none Disposition:   Status is: Inpatient Remains inpatient appropriate because: need for continued supplemental O2   Consultants:  none  Procedures:  none  Antimicrobials:  Anti-infectives (From admission, onward)    Start     Dose/Rate Route Frequency Ordered Stop   11/25/22 1000  cefTRIAXone (ROCEPHIN) 2 g in sodium chloride 0.9 % 100 mL IVPB        2 g 200 mL/hr over 30 Minutes Intravenous Every 24 hours 11/24/22 1548 11/30/22 0959   11/24/22 1800  doxycycline (VIBRAMYCIN) 100 mg in sodium chloride 0.9 % 250 mL IVPB        100 mg 125 mL/hr over 120 Minutes Intravenous 2 times daily 11/24/22 1548     11/24/22 1515  cefTRIAXone (ROCEPHIN) 2 g in sodium chloride 0.9 % 100 mL IVPB        2 g 200 mL/hr over 30 Minutes Intravenous  Once 11/24/22 1504 11/24/22 1549   11/24/22 1515  azithromycin (ZITHROMAX) 500 mg in sodium chloride 0.9 % 250 mL IVPB        500 mg 250 mL/hr over 60 Minutes Intravenous  Once 11/24/22 1504 11/24/22 1628       Subjective: Feeling Leroy Lindsey little better  Objective: Vitals:   11/26/22 1013 11/26/22 1206 11/26/22 1448 11/26/22 1641  BP: (!) 165/95 (!) 141/99  (!) 149/90  Pulse:  (!) 108  (!) 109  Resp:  20  18  Temp:  98.6 F (37 C)  98.2 F (36.8 C)  TempSrc:  Oral  Oral  SpO2:  95% 94% 96%  Weight:      Height:        Intake/Output Summary (Last 24 hours) at 11/26/2022 1741 Last data filed at 11/26/2022 1329 Gross per 24 hour  Intake 1680 ml  Output 350 ml  Net 1330 ml   Filed Weights   11/24/22 1053 11/24/22 1756  Weight: 117.9 kg 113.5 kg    Examination:  General: No acute  distress. Cardiovascular: RRR, tachycardic  Lungs: unlabored  Abdomen: Soft, nontender, nondistended  Neurological: Alert and oriented 3. Moves all extremities 4 with equal strength. Cranial nerves II through XII grossly intact. Extremities: No clubbing or cyanosis. No edema.   Data Reviewed: I have personally reviewed following labs and imaging studies  CBC: Recent Labs  Lab 11/24/22 1150 11/25/22 0423 11/26/22 0419  WBC 16.1* 22.3* 14.6*  NEUTROABS 10.5*  --  7.4  HGB 13.0 10.4* 11.3*  HCT 38.6* 31.6* 34.9*  MCV 90.4 91.1 91.1  PLT 319 276 300    Basic Metabolic Panel: Recent Labs  Lab 11/24/22 1150 11/25/22 0423 11/26/22 0419  NA 131* 133* 132*  K 3.2* 4.1 3.4*  CL 91* 98 98  CO2 27 25 25   GLUCOSE 103* 108* 88  BUN 11 12 9   CREATININE 0.92 0.71 0.72  CALCIUM 8.9 8.5* 8.5*  MG  --   --  1.9  PHOS  --   --  3.9    GFR: Estimated Creatinine Clearance: 154.5 mL/min (by C-G formula based on SCr of 0.72 mg/dL).  Liver Function Tests: Recent Labs  Lab 11/24/22 1150 11/26/22 0419  AST 25 18  ALT 35 27  ALKPHOS 103 82  BILITOT 0.8 0.5  PROT 9.2* 7.6  ALBUMIN 3.4* 3.0*    CBG: No results for input(s): "GLUCAP" in the last 168 hours.   Recent Results (from the past 240 hour(s))  Resp panel by RT-PCR (RSV, Flu Rusty Villella&B, Covid) Anterior Nasal Swab     Status: None   Collection Time: 11/24/22  3:26 PM   Specimen: Anterior Nasal Swab  Result Value Ref Range Status   SARS Coronavirus 2 by RT PCR NEGATIVE NEGATIVE Final    Comment: (NOTE) SARS-CoV-2 target nucleic acids are NOT DETECTED.  The SARS-CoV-2 RNA is generally detectable in upper respiratory specimens during the acute phase of infection. The lowest concentration of SARS-CoV-2 viral copies this assay can detect is 138 copies/mL. Murl Golladay negative result does not preclude SARS-Cov-2 infection and should not be used as the sole basis for treatment or other patient management decisions. Tarsha Blando negative result may  occur with  improper specimen collection/handling, submission of specimen other than nasopharyngeal swab, presence of viral mutation(s) within the areas targeted by this assay, and inadequate number of viral copies(<138 copies/mL). Kellen Hover negative result must be combined with clinical observations, patient history, and epidemiological information. The expected result is Negative.  Fact Sheet for Patients:  BloggerCourse.com  Fact Sheet for Healthcare Providers:  SeriousBroker.it  This test is no t yet approved or cleared by the [place] FDA and  has been authorized for detection and/or diagnosis of SARS-CoV-2 by FDA under an Emergency Use Authorization (EUA). This EUA will remain  in effect (meaning this test can be used) for the duration of the COVID-19 declaration under Section 564(b)(1) of the Act, 21 U.S.C.section 360bbb-3(b)(1), unless the authorization is terminated  or revoked sooner.       Influenza Armel Rabbani by PCR NEGATIVE NEGATIVE Final   Influenza B by PCR NEGATIVE NEGATIVE Final    Comment: (NOTE) The Xpert Xpress SARS-CoV-2/FLU/RSV plus assay is intended as an aid in the diagnosis of influenza from Nasopharyngeal swab specimens and should not be used as Olyver Hawes sole basis for treatment. Nasal washings and aspirates are unacceptable for Xpert Xpress SARS-CoV-2/FLU/RSV testing.  Fact Sheet for Patients: BloggerCourse.com  Fact Sheet for Healthcare Providers: SeriousBroker.it  This test is not yet approved or cleared by the [place] FDA and has been authorized for detection and/or diagnosis of SARS-CoV-2 by FDA under an Emergency Use Authorization (EUA). This EUA will remain in effect (meaning this test can be used) for the duration of the COVID-19 declaration under Section 564(b)(1) of the Act, 21 U.S.C. section 360bbb-3(b)(1), unless the authorization is terminated  or revoked.     Resp Syncytial Virus by PCR NEGATIVE NEGATIVE Final    Comment: (NOTE) Fact Sheet for Patients: BloggerCourse.com  Fact Sheet for Healthcare Providers: SeriousBroker.it  This test is not yet approved or cleared by the [place] FDA and has been authorized for detection and/or diagnosis of SARS-CoV-2 by FDA under an Emergency Use Authorization (EUA). This EUA will remain in effect (meaning this test can be used) for the duration of the COVID-19 declaration under Section 564(b)(1) of the Act, 21 U.S.C. section 360bbb-3(b)(1), unless the authorization is terminated or revoked.  Performed at [facility], [address], [place], [place] [address]  Respiratory (~20 pathogens) panel by PCR     Status: None   Collection Time: 11/24/22  4:08 PM   Specimen: Nasopharyngeal Swab; Respiratory  Result Value Ref Range Status   Adenovirus NOT DETECTED NOT DETECTED Final   Coronavirus 229E NOT DETECTED NOT DETECTED Final    Comment: (NOTE) The Coronavirus on the Respiratory Panel, DOES NOT test for the novel  Coronavirus (2019 nCoV)    Coronavirus HKU1 NOT DETECTED NOT DETECTED Final   Coronavirus NL63 NOT DETECTED NOT DETECTED Final   Coronavirus OC43 NOT DETECTED NOT DETECTED Final   Metapneumovirus NOT DETECTED NOT DETECTED Final   Rhinovirus / Enterovirus NOT DETECTED NOT DETECTED Final   Influenza Adelita Hone NOT DETECTED NOT DETECTED Final   Influenza B NOT DETECTED NOT DETECTED Final   Parainfluenza Virus 1 NOT DETECTED NOT DETECTED Final   Parainfluenza Virus 2 NOT DETECTED NOT DETECTED Final   Parainfluenza Virus 3 NOT DETECTED NOT DETECTED Final   Parainfluenza Virus 4 NOT DETECTED NOT DETECTED Final   Respiratory Syncytial Virus NOT DETECTED NOT DETECTED Final   Bordetella pertussis NOT DETECTED NOT DETECTED Final   Bordetella Parapertussis NOT DETECTED NOT DETECTED Final   Chlamydophila  pneumoniae NOT DETECTED NOT DETECTED Final   Mycoplasma pneumoniae NOT DETECTED NOT DETECTED Final    Comment: Performed at [facility], [address] [place], [place] [address]  MRSA Next Gen by PCR, Nasal     Status: None   Collection Time: 11/25/22  3:56 PM   Specimen: Nasal Mucosa; Nasal Swab  Result Value Ref Range Status   MRSA by PCR Next Gen NOT DETECTED NOT DETECTED Final    Comment: (NOTE) The GeneXpert MRSA Assay (FDA approved for NASAL specimens only), is one component of [place]la [place] comprehensive MRSA colonization surveillance program. It is not intended to diagnose MRSA infection nor to guide or monitor treatment for MRSA infections. Test performance is not FDA approved in patients less than 76 years old. Performed at [facility], [address], [place], [place] [address]   Expectorated Sputum Assessment w Gram Stain, Rflx to Resp Cult     Status: None   Collection Time: 11/25/22  3:56 PM   Specimen: Expectorated Sputum  Result Value Ref Range Status   Specimen Description EXPECTORATED SPUTUM  Final   Special Requests NONE  Final   Sputum evaluation   Final    THIS SPECIMEN IS ACCEPTABLE FOR SPUTUM CULTURE Performed at [facility], [address], [place], [place] [address]    Report Status 11/25/2022 FINAL  Final  Culture, Respiratory w Gram Stain     Status: None (Preliminary result)   Collection Time: 11/25/22  3:56 PM  Result Value Ref Range Status   Specimen Description   Final    EXPECTORATED SPUTUM Performed at [facility], [address] [place], [place] [address]    Special Requests   Final    NONE Reflexed from [id number] Performed at [facility], [address] [place], [place] [address]    Gram Stain   Final    ABUNDANT WBC PRESENT, PREDOMINANTLY PMN FEW GRAM NEGATIVE RODS RARE GRAM POSITIVE RODS RARE BUDDING YEAST SEEN    Culture   Final    NO GROWTH < 24  HOURS Performed at [facility], [address] [place], [place] [address]    Report Status PENDING  Incomplete         Radiology Studies: ECHOCARDIOGRAM  COMPLETE  Result Date: 11/25/2022    ECHOCARDIOGRAM REPORT   Patient Name:   AWAN SLAVICH Date of Exam: 11/25/2022 Medical Rec #:  [id number]     Height:       68.0 in Accession #:    [id number]    Weight:       250.2 lb Date of Birth:  Mar 16, 1986      BSA:          2.248 m Patient Age:    36 years      BP:           160/101 mmHg Patient Gender: M             HR:           127 bpm. Exam Location:  Inpatient Procedure: 2D Echo, Color Doppler and Cardiac Doppler Indications:    Tachycardia  History:        Patient has no prior history of Echocardiogram examinations.                 Arrythmias:Tachycardia; Risk Factors:Current Smoker and Obesity.                 Sepsis d/t pneumonia.  Sonographer:    Milbert Coulter Referring Phys: [phone] Bill Mcvey CALDWELL POWELL JR  Sonographer Comments: No subcostal window, suboptimal apical window and patient is obese. Image acquisition challenging due to patient body habitus, Image acquisition challenging due to respiratory motion and tachycardia, and patient scanned in high Fowler position d/t pneumonia and difficulty breathing. IMPRESSIONS  1. Very tecnically difficult study due to poor windows, patient had difficulty breathing during the exam, unable to lay supine, Definity contrast not given  2. Left ventricular ejection fraction, by estimation, is 65 to 70%. The left ventricle has normal function. The left ventricle has no regional wall motion abnormalities. There is moderate left ventricular hypertrophy. Left ventricular diastolic parameters are indeterminate.  3. Right ventricular systolic function is normal. The right ventricular size is normal.  4. The mitral valve is grossly normal. No evidence of mitral valve regurgitation.  5. The aortic valve was not well visualized. Aortic valve regurgitation is not visualized.  No aortic stenosis is present. Comparison(s): No prior Echocardiogram. FINDINGS  Left Ventricle: Left ventricular ejection fraction, by estimation, is 65 to 70%. The left ventricle has normal function. The left ventricle has no regional wall motion abnormalities. The left ventricular internal cavity size was normal in size. There is  moderate left ventricular hypertrophy. Left ventricular diastolic parameters are indeterminate. Right Ventricle: The right ventricular size is normal. No increase in right ventricular wall thickness. Right ventricular systolic function is normal. Left Atrium: Left atrial size was not assessed. Right Atrium: Right atrial size was not assessed. Pericardium: There is no evidence of pericardial effusion. Mitral Valve: The mitral valve is grossly normal. No evidence of mitral valve regurgitation. Tricuspid Valve: The tricuspid valve is not well visualized. Tricuspid valve regurgitation is not demonstrated. Aortic Valve: The aortic valve was not well visualized. Aortic valve regurgitation is not visualized. No aortic stenosis is present. Pulmonic Valve: The pulmonic valve was not well visualized. Pulmonic valve regurgitation is not visualized. Aorta: The aortic root and ascending aorta are structurally normal, with no evidence of dilitation. Venous: The inferior vena cava was not well visualized. IAS/Shunts: No atrial level shunt detected by color flow Doppler.  LEFT VENTRICLE PLAX 2D LVIDd:         3.80 cm LVIDs:  2.30 cm LV PW:         1.30 cm LV IVS:        1.30 cm LVOT diam:     2.10 cm LVOT Area:     3.46 cm  RIGHT VENTRICLE RV S prime:     20.50 cm/s TAPSE (M-mode): 2.6 cm LEFT ATRIUM         Index LA diam:    3.60 cm 1.60 cm/m  MITRAL VALVE MV Area (PHT): 5.31 cm    SHUNTS MV Decel Time: 143 msec    Systemic Diam: 2.10 cm MV E velocity: 68.10 cm/s MV Gerlene Glassburn velocity: 63.00 cm/s MV E/Sohum Delillo ratio:  1.08 Zoila Shutter MD Electronically signed by Zoila Shutter MD Signature Date/Time:  11/25/2022/4:35:29 PM    Final         Scheduled Meds:  amLODipine  5 mg Oral Daily   hydrochlorothiazide  12.5 mg Oral Daily   ipratropium-albuterol  3 mL Nebulization Q6H WA   [START ON 11/27/2022] potassium chloride  20 mEq Oral Daily   Continuous Infusions:  cefTRIAXone (ROCEPHIN)  IV 2 g (11/26/22 1025)   doxycycline (VIBRAMYCIN) IV 100 mg (11/26/22 1104)     LOS: 2 days    Time spent: over 30 min    Lacretia Nicks, MD [facility]   To contact the attending provider between 7A-7P or the covering provider during after hours 7P-7A, please log into the web site [email or web address] and access using universal [place] password for that web site. If you do not have the password, please call the hospital operator.  11/26/2022, 5:41 PM

## 2022-11-26 NOTE — Plan of Care (Signed)

## 2022-11-26 NOTE — Progress Notes (Signed)
SATURATION QUALIFICATIONS: (This note is used to comply with regulatory documentation for home oxygen)  Patient Saturations on Room Air at Rest = 95%  Patient Saturations on Room Air while Ambulating = 95%  Patient Saturations on 0 Liters of oxygen while Ambulating = 95%  Patient did not require any oxygen while ambulating. Patient's O2 stats stay at 95% on Room Air.

## 2022-11-27 DIAGNOSIS — J189 Pneumonia, unspecified organism: Secondary | ICD-10-CM | POA: Diagnosis not present

## 2022-11-27 DIAGNOSIS — A419 Sepsis, unspecified organism: Secondary | ICD-10-CM | POA: Diagnosis not present

## 2022-11-27 LAB — CBC
HCT: 36.9 % — ABNORMAL LOW (ref 39.0–52.0)
Hemoglobin: 12 g/dL — ABNORMAL LOW (ref 13.0–17.0)
MCH: 29.3 pg (ref 26.0–34.0)
MCHC: 32.5 g/dL (ref 30.0–36.0)
MCV: 90 fL (ref 80.0–100.0)
Platelets: 317 10*3/uL (ref 150–400)
RBC: 4.1 MIL/uL — ABNORMAL LOW (ref 4.22–5.81)
RDW: 13.9 % (ref 11.5–15.5)
WBC: 11.3 10*3/uL — ABNORMAL HIGH (ref 4.0–10.5)
nRBC: 0 % (ref 0.0–0.2)

## 2022-11-27 LAB — BASIC METABOLIC PANEL
Anion gap: 12 (ref 5–15)
BUN: 11 mg/dL (ref 6–20)
CO2: 21 mmol/L — ABNORMAL LOW (ref 22–32)
Calcium: 9 mg/dL (ref 8.9–10.3)
Chloride: 99 mmol/L (ref 98–111)
Creatinine, Ser: 0.76 mg/dL (ref 0.61–1.24)
GFR, Estimated: >60 mL/min (ref >60)
Glucose, Bld: 89 mg/dL (ref 70–99)
Potassium: 3.9 mmol/L (ref 3.5–5.1)
Sodium: 132 mmol/L — ABNORMAL LOW (ref 135–145)

## 2022-11-27 LAB — CULTURE, RESPIRATORY W GRAM STAIN: Culture: NORMAL

## 2022-11-27 LAB — PHOSPHORUS: Phosphorus: 5.3 mg/dL — ABNORMAL HIGH (ref 2.5–4.6)

## 2022-11-27 LAB — TSH: TSH: 1.185 u[IU]/mL (ref 0.350–4.500)

## 2022-11-27 LAB — MAGNESIUM: Magnesium: 2 mg/dL (ref 1.7–2.4)

## 2022-11-27 MED ORDER — DOXYCYCLINE HYCLATE 100 MG PO CAPS: 100.0000 mg | ORAL_CAPSULE | Freq: Two times a day (BID) | ORAL | 0 refills | Status: AC

## 2022-11-27 MED ORDER — CARVEDILOL 6.25 MG PO TABS: 12.5000 mg | ORAL_TABLET | Freq: Two times a day (BID) | ORAL | 1 refills | Status: DC

## 2022-11-27 MED ORDER — CARVEDILOL 6.25 MG PO TABS
6.2500 mg | ORAL_TABLET | Freq: Two times a day (BID) | ORAL | Status: DC
  Administered 2022-11-27 (×2): 6.25 mg via ORAL
  Filled 2022-11-27 (×2): qty 1

## 2022-11-27 MED ORDER — HYDROCHLOROTHIAZIDE 12.5 MG PO CAPS: 12.5000 mg | ORAL_CAPSULE | Freq: Every day | ORAL | 1 refills | Status: DC

## 2022-11-27 MED ORDER — AMOXICILLIN 500 MG PO CAPS: 1000.0000 mg | ORAL_CAPSULE | Freq: Three times a day (TID) | ORAL | 0 refills | Status: AC

## 2022-11-27 MED ORDER — POTASSIUM CHLORIDE CRYS ER 10 MEQ PO TBCR: 10.0000 meq | EXTENDED_RELEASE_TABLET | Freq: Every day | ORAL | 0 refills | Status: DC

## 2022-11-27 MED ORDER — AMLODIPINE BESYLATE 5 MG PO TABS: 5.0000 mg | ORAL_TABLET | Freq: Every day | ORAL | 1 refills | Status: DC

## 2022-11-27 NOTE — Progress Notes (Signed)
Patient was given discharge orders to go home. Patient was given discharge paperwork/instructions. RN went over discharge paperwork/instructions with the patient. All questions or concerns were addressed and answered. Patient left the hospital stable, had discharge paperwork/instructions, and had all personal belongings.

## 2022-11-27 NOTE — TOC Initial Note (Signed)
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  Expected Discharge Plan: Home/Self Care Barriers to Discharge: No Barriers Identified   Patient Goals and CMS Choice Patient states their goals for this hospitalization and ongoing recovery are:: home          Expected Discharge Plan and Services   Discharge Planning Services: CM Consult   Living arrangements for the past 2 months: Apartment Expected Discharge Date: 11/27/22                 DME Agency: NA       HH Arranged: NA          Prior Living Arrangements/Services Living arrangements for the past 2 months: Apartment Lives with:: Parents Patient language and need for interpreter reviewed:: Yes Do you feel safe going back to the place where you live?: Yes      Need for Family Participation in Patient Care: Yes (Comment) Care giver support system in place?: Yes (comment) Current home services:  (n/a) Criminal Activity/Legal Involvement Pertinent to Current Situation/Hospitalization: Yes - Comment as needed  Activities of Daily Living Home Assistive Devices/Equipment: None ADL Screening (condition at time of admission) Patient's cognitive ability adequate to safely complete daily activities?: Yes Is the patient deaf or have difficulty hearing?: No Does the patient have difficulty seeing, even when wearing glasses/contacts?: No Does the patient have  difficulty concentrating, remembering, or making decisions?: No Patient able to express need for assistance with ADLs?: Yes Does the patient have difficulty dressing or bathing?: No Independently performs ADLs?: Yes (appropriate for developmental age) Does the patient have difficulty walking or climbing stairs?: Yes Weakness of Legs: None Weakness of Arms/Hands: None  Permission Sought/Granted Permission sought to share information with : Case Manager Permission granted to share information with : Yes, Verbal Permission Granted  Share Information with NAME: Burnard Bunting, RN, CM     Permission granted to share info w Relationship: Fenwick Prevatt (mother) [phone]     Emotional Assessment Appearance:: Appears stated age Attitude/Demeanor/Rapport: Gracious Affect (typically observed): Accepting Orientation: : Oriented to Self, Oriented to Place, Oriented to  Time, Oriented to Situation Alcohol / Substance Use: Not Applicable Psych Involvement: No (comment)  Admission diagnosis:  COPD exacerbation ([name]) [J44.1] Sepsis due to pneumonia ([name]) [J18.9, A41.9] Multifocal pneumonia [J18.9] Patient Active Problem List   Diagnosis Date Noted   Sepsis due to pneumonia ([name]) 11/24/2022   Acute respiratory failure with hypoxia ([name]) 11/24/2022   Depression 11/24/2022   AKI (acute kidney injury) ([name]) 04/05/2018   Dehydration 04/05/2018   Leukocytosis 04/05/2018   Hypokalemia 04/05/2018   Metabolic acidosis, increased anion gap 04/05/2018   Transaminitis 04/05/2018   Obesity 04/05/2018   Tobacco use 04/05/2018   PCP:  Patient, No Pcp Per Pharmacy:   [id number] - [place], [place] - [address] [place] [address] Phone: [phone] Fax: [phone]     Social Determinants of Health (SDOH) Social History: SDOH Screenings   Food Insecurity: No Food Insecurity (11/27/2022)  Housing: Low Risk  (11/27/2022)  Transportation Needs: No  Transportation Needs (11/27/2022)  Utilities: Not At Risk (11/27/2022)  Tobacco Use: High Risk (11/24/2022)   SDOH Interventions: Food Insecurity Interventions: Inpatient TOC Housing Interventions: Inpatient TOC Transportation Interventions: Inpatient TOC Utilities Interventions: Inpatient TOC   Readmission Risk Interventions     No data to display

## 2022-11-27 NOTE — Progress Notes (Signed)
   11/27/22 0924  Assess: MEWS Score  Temp 98 F (36.7 C)  BP (!) 145/82  MAP (mmHg) 97  Pulse Rate (!) 116  Resp 18  Level of Consciousness Alert  SpO2 96 %  O2 Device Room Air  Assess: MEWS Score  MEWS Temp 0  MEWS Systolic 0  MEWS Pulse 2  MEWS RR 0  MEWS LOC 0  MEWS Score 2  MEWS Score Color Yellow  Assess: if the MEWS score is Yellow or Red  Were vital signs taken at a resting state? Yes  Focused Assessment No change from prior assessment (patient has previously been yellow before)  Does the patient meet 2 or more of the SIRS criteria? No  Does the patient have a confirmed or suspected source of infection? Yes  MEWS guidelines implemented  No, previously yellow, continue vital signs every 4 hours  Notify: Charge Nurse/RN  Name of Charge Nurse/RN Notified Launa Flight, RN  Assess: SIRS CRITERIA  SIRS Temperature  0  SIRS Pulse 1  SIRS Respirations  0  SIRS WBC 0  SIRS Score Sum  1   Vitals were taken prior to scheduled BP medications. MD is already aware of patient's elevated heart rate. Patient was started on 6.25 mg carvedilol by mouth two times daily with meals and was given with scheduled morning medications. Charge nurse was notified. Will continue to monitor with any changes.

## 2022-11-27 NOTE — Progress Notes (Signed)
Notified MD that cardiac monitoring called and informed RN that patient's heart rate jumped up into the 140's. Checked on patient. Patient was currently up in the bathroom.

## 2022-11-27 NOTE — Plan of Care (Signed)
  Problem: Fluid Volume: Goal: Hemodynamic stability will improve Outcome: Completed/Met   Problem: Clinical Measurements: Goal: Diagnostic test results will improve Outcome: Completed/Met Goal: Signs and symptoms of infection will decrease Outcome: Completed/Met   Problem: Respiratory: Goal: Ability to maintain adequate ventilation will improve Outcome: Completed/Met   Problem: Education: Goal: Knowledge of General Education information will improve Description: Including pain rating scale, medication(s)/side effects and non-pharmacologic comfort measures Outcome: Completed/Met   Problem: Health Behavior/Discharge Planning: Goal: Ability to manage health-related needs will improve Outcome: Completed/Met   Problem: Clinical Measurements: Goal: Ability to maintain clinical measurements within normal limits will improve Outcome: Completed/Met Goal: Will remain free from infection Outcome: Completed/Met Goal: Diagnostic test results will improve Outcome: Completed/Met Goal: Respiratory complications will improve Outcome: Completed/Met Goal: Cardiovascular complication will be avoided Outcome: Completed/Met   Problem: Activity: Goal: Risk for activity intolerance will decrease Outcome: Completed/Met   Problem: Nutrition: Goal: Adequate nutrition will be maintained Outcome: Completed/Met   Problem: Coping: Goal: Level of anxiety will decrease Outcome: Completed/Met   Problem: Elimination: Goal: Will not experience complications related to bowel motility Outcome: Completed/Met Goal: Will not experience complications related to urinary retention Outcome: Completed/Met   Problem: Pain Managment: Goal: General experience of comfort will improve Outcome: Completed/Met   Problem: Safety: Goal: Ability to remain free from injury will improve Outcome: Completed/Met   Problem: Skin Integrity: Goal: Risk for impaired skin integrity will decrease Outcome:  Completed/Met   

## 2022-11-27 NOTE — Discharge Summary (Signed)
Physician Discharge Summary  Trashawn Warden ZOX:[id number] DOB: May 21, 1986 DOA: 11/24/2022  PCP: Patient, No Pcp Per  Admit date: 11/24/2022 Discharge date: 11/27/2022  Time spent: 40 minutes  Recommendations for Outpatient Follow-up:  Follow outpatient CBC/CMP  Follow with pulmonology outpatient for subacute SOB over past month, needs PFT's after resolution of pneumonia Follow with cardiology outpatient -> inappropriate sinus tach, started on coreg  Follow low cortisol outpatient (in setting of steroids) Follow gynecomastia outpatient, additional w/u per PCP Follow bp outpatient  Urine legionella pending  Discharge Diagnoses:  Principal Problem:   Sepsis due to pneumonia [facility]) Active Problems:   Acute respiratory failure with hypoxia ([name])   Hypokalemia   Obesity   Depression   Discharge Condition: stable  Diet recommendation: heart healthy  Filed Weights   11/24/22 1053 11/24/22 1756  Weight: 117.9 kg 113.5 kg    History of present illness:   Latonya Maleki is Akaiya Touchette 37 y.o. male with medical history significant for asthma, depression, morbid obesity (BMI 39) who presents to the emergency room for evaluation of refractory cough. Imaging concerning for pneumonia.  He's improved with antibiotics.  Hospitalization complicated by inappropriate sinus tach.    See below for additional details   Hospital Course:  Assessment and Plan:  Acute Hypoxic Respiratory Failure  Shortness of Breath  Community Acquired Pneumonia Seems to be acute on subacute process worsening over the past month Was diagnosed with enterovirus 4/21 (per my discussion with [facility] ED staff) -> possible superinfection?  Doesn't appear he was sent home with any abx or steroids per my discussion.  Will request records to review (none resulted). CT with diffuse bilateral predominantly ground glass airspace opacities (multifocal pneumonia vs pulm edema) Negative RVP Negative covid, flu, RSV MRSA PCR  negative Urine strep negative, urine legionella pending Sputum cx -> normal resp flora Continue ceftriaxone/doxycycline for now -> discharge on amox/doxy to complete 5 day course Negative HIV No blood cultures collected at presentation, he's on abx, will hold off unless febrile Significant smoking hx, probably would benefit from PFT's outside of acute illness He should see pulmonology after this hospital stay  Inappropriate Sinus Tachycardia As high as 140s-150s overnight Sinus tachycardia Echo with 65-70%, no regional wall motion abnormalities, moderate LVH TSH wnl CT PE protocol on admission without PE Suspect due to above -> coreg started today, I think ok to follow outpatient with cardiology - improved at time of discharge to 100's  Elevated BNP Not obviously volume overloaded on exam Given dose of lasix, didn't note significant improvement Will monitor for now and follow echo (normal EF)   Tobacco Abuse 22 pack year smoking hx Encourage cessation   Hypertension Start amlodipine, thiazide, beta blocker with inappropriate sinus tach    Low Cortisol I think this is suppressed in setting of IV steroids, can be followed up later as outpatient  Gynecomastia  Needs additional w/u outpatient       Procedures: Echo IMPRESSIONS     1. Very tecnically difficult study due to poor windows, patient had  difficulty breathing during the exam, unable to lay supine, Definity  contrast not given   2. Left ventricular ejection fraction, by estimation, is 65 to 70%. The  left ventricle has normal function. The left ventricle has no regional  wall motion abnormalities. There is moderate left ventricular hypertrophy.  Left ventricular diastolic  parameters are indeterminate.   3. Right ventricular systolic function is normal. The right ventricular  size is normal.   4. The  mitral valve is grossly normal. No evidence of mitral valve  regurgitation.   5. The aortic valve was not  well visualized. Aortic valve regurgitation  is not visualized. No aortic stenosis is present.   Comparison(s): No prior Echocardiogram.    Consultations: none  Discharge Exam: Vitals:   11/27/22 0924 11/27/22 1228  BP: (!) 145/82 (!) 133/92  Pulse: (!) 116 (!) 112  Resp: 18 20  Temp: 98 F (36.7 C) 98.3 F (36.8 C)  SpO2: 96% 94%   Eager to discharge home if possible  General: No acute distress. Cardiovascular: sinus tach Lungs: Clear to auscultation bilaterally Abdomen: Soft, nontender, nondistended  Neurological: Alert and oriented 3. Moves all extremities 4 with equal strength. Cranial nerves II through XII grossly intact. Extremities: No clubbing or cyanosis. No edema.   Discharge Instructions   Discharge Instructions     (HEART FAILURE PATIENTS) Call MD:  Anytime you have any of the following symptoms: 1) 3 pound weight gain in 24 hours or 5 pounds in 1 week 2) shortness of breath, with or without Amaranta Mehl dry hacking cough 3) swelling in the hands, feet or stomach 4) if you have to sleep on extra pillows at night in order to breathe.   Complete by: As directed    Ambulatory referral to Cardiology   Complete by: As directed    Ambulatory referral to Pulmonology   Complete by: As directed    Reason for referral: Asthma/COPD   Call MD for:  difficulty breathing, headache or visual disturbances   Complete by: As directed    Call MD for:  extreme fatigue   Complete by: As directed    Call MD for:  hives   Complete by: As directed    Call MD for:  persistant dizziness or light-headedness   Complete by: As directed    Call MD for:  persistant nausea and vomiting   Complete by: As directed    Call MD for:  redness, tenderness, or signs of infection (pain, swelling, redness, odor or green/yellow discharge around incision site)   Complete by: As directed    Call MD for:  severe uncontrolled pain   Complete by: As directed    Call MD for:  temperature >100.4   Complete  by: As directed    Diet - low sodium heart healthy   Complete by: As directed    Discharge instructions   Complete by: As directed    You were seen for pneumonia.   You've improved with Idabell Picking course of antibiotics.  You'll need to follow up with Tata Timmins primary care doctor as an outpatient to assume your care.  Because your shortness of breath has been going on 1 month and you have an extensive smoking history, you should see the lung doctors.    Quitting smoking is extremely important for your health.    Continue to use the albuterol as needed for shortness of breath.   Your heart rate is on the fast side.  We'll start you on Jearlene Bridwell medicine called coreg to help slow it down.  You need to see the heart doctors for your inappropriately fast heart rate (I've placed Tekila Caillouet referral).    Your blood pressure is high.  We've started you on amlodipine, HCTZ, and coreg for your blood pressure.  Follow up with your PCP within 1 week or so for repeat blood pressure check and to see if any adjustments are recommended.  You have gynecomastia on imaging.  This should  be followed up with PCP to see if you need additional workup.  Your stress hormone was low here.  This should be rechecked with your new PCP outpatient.  Weight loss will be important long term.   Return for new, recurrent, or worsening symptoms.  Please ask your PCP to request records from this hospitalization so they know what was done and what the next steps will be.  Ensure you see Oneita Allmon PCP, cardiologist, and pulmonologist in follow up.   Increase activity slowly   Complete by: As directed       Allergies as of 11/27/2022   No Known Allergies      Medication List     TAKE these medications    Allegra Allergy 180 MG tablet Generic drug: fexofenadine Take 180 mg by mouth daily.   amLODipine 5 MG tablet Commonly known as: NORVASC Take 1 tablet (5 mg total) by mouth daily. Start taking on: Nov 28, 2022   amoxicillin 500 MG  capsule Commonly known as: AMOXIL Take 2 capsules (1,000 mg total) by mouth 3 (three) times daily for 1 day. Start tomorrow 5/5.   carvedilol 6.25 MG tablet Commonly known as: COREG Take 2 tablets (12.5 mg total) by mouth 2 (two) times daily with Brittie Whisnant meal.   chlorpheniramine-HYDROcodone 10-8 MG/5ML Commonly known as: TUSSIONEX Take 5 mLs by mouth 2 (two) times daily.   doxycycline 100 MG capsule Commonly known as: VIBRAMYCIN Take 1 capsule (100 mg total) by mouth 2 (two) times daily for 2 days. Start tonight, 5/4.   hydrochlorothiazide 12.5 MG capsule Commonly known as: MICROZIDE Take 1 capsule (12.5 mg total) by mouth daily.   Mucinex Fast-Max 10-650-400 MG/20ML Liqd Generic drug: Phenylephrine-APAP-guaiFENesin Take 10 mLs by mouth every 6 (six) hours as needed (for coughing).   ondansetron 4 MG disintegrating tablet Commonly known as: ZOFRAN-ODT Take 4 mg by mouth every 8 (eight) hours as needed for nausea or vomiting (dissolve orally).   potassium chloride 10 MEQ tablet Commonly known as: KLOR-CON M Take 1 tablet (10 mEq total) by mouth daily. Follow repeat labs within 1 week to see if the dose needs to be adjusted Start taking on: Nov 28, 2022   Ventolin HFA 108 (90 Base) MCG/ACT inhaler Generic drug: albuterol Inhale 2 puffs into the lungs every 4 (four) hours as needed for wheezing or shortness of breath.       No Known Allergies    The results of significant diagnostics from this hospitalization (including imaging, microbiology, ancillary and laboratory) are listed below for reference.    Significant Diagnostic Studies: ECHOCARDIOGRAM COMPLETE  Result Date: 11/25/2022    ECHOCARDIOGRAM REPORT   Patient Name:   HUTCHISON REDDISH Date of Exam: 11/25/2022 Medical Rec #:  [id number]     Height:       68.0 in Accession #:    [id number]    Weight:       250.2 lb Date of Birth:  [birth year]      BSA:          2.248 m Patient Age:    36 years      BP:           160/101 mmHg Patient  Gender: M             HR:           127 bpm. Exam Location:  Inpatient Procedure: 2D Echo, Color Doppler and Cardiac Doppler Indications:    Tachycardia  History:  Patient has no prior history of Echocardiogram examinations.                 Arrythmias:Tachycardia; Risk Factors:Current Smoker and Obesity.                 Sepsis d/t pneumonia.  Sonographer:    Milbert Coulter Referring Phys: [id number] Othman Masur CALDWELL POWELL JR  Sonographer Comments: No subcostal window, suboptimal apical window and patient is obese. Image acquisition challenging due to patient body habitus, Image acquisition challenging due to respiratory motion and tachycardia, and patient scanned in high Fowler position d/t pneumonia and difficulty breathing. IMPRESSIONS  1. Very tecnically difficult study due to poor windows, patient had difficulty breathing during the exam, unable to lay supine, Definity contrast not given  2. Left ventricular ejection fraction, by estimation, is 65 to 70%. The left ventricle has normal function. The left ventricle has no regional wall motion abnormalities. There is moderate left ventricular hypertrophy. Left ventricular diastolic parameters are indeterminate.  3. Right ventricular systolic function is normal. The right ventricular size is normal.  4. The mitral valve is grossly normal. No evidence of mitral valve regurgitation.  5. The aortic valve was not well visualized. Aortic valve regurgitation is not visualized. No aortic stenosis is present. Comparison(s): No prior Echocardiogram. FINDINGS  Left Ventricle: Left ventricular ejection fraction, by estimation, is 65 to 70%. The left ventricle has normal function. The left ventricle has no regional wall motion abnormalities. The left ventricular internal cavity size was normal in size. There is  moderate left ventricular hypertrophy. Left ventricular diastolic parameters are indeterminate. Right Ventricle: The right ventricular size is normal. No increase in right  ventricular wall thickness. Right ventricular systolic function is normal. Left Atrium: Left atrial size was not assessed. Right Atrium: Right atrial size was not assessed. Pericardium: There is no evidence of pericardial effusion. Mitral Valve: The mitral valve is grossly normal. No evidence of mitral valve regurgitation. Tricuspid Valve: The tricuspid valve is not well visualized. Tricuspid valve regurgitation is not demonstrated. Aortic Valve: The aortic valve was not well visualized. Aortic valve regurgitation is not visualized. No aortic stenosis is present. Pulmonic Valve: The pulmonic valve was not well visualized. Pulmonic valve regurgitation is not visualized. Aorta: The aortic root and ascending aorta are structurally normal, with no evidence of dilitation. Venous: The inferior vena cava was not well visualized. IAS/Shunts: No atrial level shunt detected by color flow Doppler.  LEFT VENTRICLE PLAX 2D LVIDd:         3.80 cm LVIDs:         2.30 cm LV PW:         1.30 cm LV IVS:        1.30 cm LVOT diam:     2.10 cm LVOT Area:     3.46 cm  RIGHT VENTRICLE RV S prime:     20.50 cm/s TAPSE (M-mode): 2.6 cm LEFT ATRIUM         Index LA diam:    3.60 cm 1.60 cm/m  MITRAL VALVE MV Area (PHT): 5.31 cm    SHUNTS MV Decel Time: 143 msec    Systemic Diam: 2.10 cm MV E velocity: 68.10 cm/s MV Mahmud Keithly velocity: 63.00 cm/s MV E/Kimberlye Dilger ratio:  1.08 Zoila Shutter MD Electronically signed by Zoila Shutter MD Signature Date/Time: 11/25/2022/4:35:29 PM    Final    CT Angio Chest PE W and/or Wo Contrast  Result Date: 11/24/2022 CLINICAL DATA:  Pulmonary embolism (PE) suspected, high prob  EXAM: CT ANGIOGRAPHY CHEST WITH CONTRAST TECHNIQUE: Multidetector CT imaging of the chest was performed using the standard protocol during bolus administration of intravenous contrast. Multiplanar CT image reconstructions and MIPs were obtained to evaluate the vascular anatomy. RADIATION DOSE REDUCTION: This exam was performed according to the  departmental dose-optimization program which includes automated exposure control, adjustment of the mA and/or kV according to patient size and/or use of iterative reconstruction technique. CONTRAST:  OMNIPAQUE IOHEXOL 350 MG/ML SOLN COMPARISON:  Chest x-ray 11/24/2022 FINDINGS: Cardiovascular: Satisfactory opacification of the pulmonary arteries to the segmental level. No evidence of pulmonary embolism. Thoracic aorta is normal in course and caliber. Normal heart size. No pericardial effusion. Mediastinum/Nodes: Mildly enlarged right paratracheal lymph node measuring 11 mm (series 4, image 41). Mildly enlarged AP window node measuring 10 mm (series 4, image 48). 10 mm right hilar node (series 4, image 54). No enlarged axillary or left hilar lymph nodes. Thyroid gland, trachea, and esophagus are within normal limits. Lungs/Pleura: Diffuse interstitial and alveolar opacities bilaterally, which are predominantly ground-glass. Airspace disease is most confluent within the bilateral upper lobes and within the right middle lobe. There is Kimsey Demaree small amount of atelectasis within the lingula. No pleural effusion or pneumothorax. Upper Abdomen: No acute abnormality. Musculoskeletal: Prominent right-sided gynecomastia. No acute or significant osseous abnormality. Review of the MIP images confirms the above findings. IMPRESSION: 1. No evidence of pulmonary embolism. 2. Diffuse bilateral predominantly ground-glass airspace opacities. Findings are favored to represent multifocal pneumonia although pulmonary edema is also Jarry Manon consideration. 3. Mildly enlarged mediastinal and right hilar lymph nodes, likely reactive. 4. Prominent right-sided gynecomastia. Electronically Signed   By: Duanne Guess D.O.   On: 11/24/2022 14:34   DG Chest 2 View  Result Date: 11/24/2022 CLINICAL DATA:  Cough worsening over the last month EXAM: CHEST - 2 VIEW COMPARISON:  X-ray 11/14/2022 FINDINGS: No pneumothorax or effusion. Normal  cardiopericardial silhouette. Subtle hazy bilateral ill-defined parenchymal opacities are identified. Yoneko Talerico subtle infiltrate is possible. Possibilities include surgical clips in the upper abdomen. Atypical or viral process. IMPRESSION: Subtle bilateral patchy ill-defined opacity suggested compared to the prior examination. An infiltrative process is possible. Recommend follow-up Electronically Signed   By: Karen Kays M.D.   On: 11/24/2022 11:20    Microbiology: Recent Results (from the past 240 hour(s))  Resp panel by RT-PCR (RSV, Flu Victorya Hillman&B, Covid) Anterior Nasal Swab     Status: None   Collection Time: 11/24/22  3:26 PM   Specimen: Anterior Nasal Swab  Result Value Ref Range Status   SARS Coronavirus 2 by RT PCR NEGATIVE NEGATIVE Final    Comment: (NOTE) SARS-CoV-2 target nucleic acids are NOT DETECTED.  The SARS-CoV-2 RNA is generally detectable in upper respiratory specimens during the acute phase of infection. The lowest concentration of SARS-CoV-2 viral copies this assay can detect is 138 copies/mL. Juleen Sorrels negative result does not preclude SARS-Cov-2 infection and should not be used as the sole basis for treatment or other patient management decisions. Abriella Filkins negative result may occur with  improper specimen collection/handling, submission of specimen other than nasopharyngeal swab, presence of viral mutation(s) within the areas targeted by this assay, and inadequate number of viral copies(<138 copies/mL). Athleen Feltner negative result must be combined with clinical observations, patient history, and epidemiological information. The expected result is Negative.  Fact Sheet for Patients:  BloggerCourse.com  Fact Sheet for Healthcare Providers:  SeriousBroker.it  This test is no t yet approved or cleared by the [place] and  has been authorized for detection and/or diagnosis of SARS-CoV-2 by FDA under an Emergency Use Authorization (EUA). This EUA  will remain  in effect (meaning this test can be used) for the duration of the COVID-19 declaration under Section 564(b)(1) of the Act, 21 U.S.C.section 360bbb-3(b)(1), unless the authorization is terminated  or revoked sooner.       Influenza Lariza Cothron by PCR NEGATIVE NEGATIVE Final   Influenza B by PCR NEGATIVE NEGATIVE Final    Comment: (NOTE) The Xpert Xpress SARS-CoV-2/FLU/RSV plus assay is intended as an aid in the diagnosis of influenza from Nasopharyngeal swab specimens and should not be used as Ebrima Ranta sole basis for treatment. Nasal washings and aspirates are unacceptable for Xpert Xpress SARS-CoV-2/FLU/RSV testing.  Fact Sheet for Patients: BloggerCourse.com  Fact Sheet for Healthcare Providers: SeriousBroker.it  This test is not yet approved or cleared by the [place] FDA and has been authorized for detection and/or diagnosis of SARS-CoV-2 by FDA under an Emergency Use Authorization (EUA). This EUA will remain in effect (meaning this test can be used) for the duration of the COVID-19 declaration under Section 564(b)(1) of the Act, 21 U.S.C. section 360bbb-3(b)(1), unless the authorization is terminated or revoked.     Resp Syncytial Virus by PCR NEGATIVE NEGATIVE Final    Comment: (NOTE) Fact Sheet for Patients: BloggerCourse.com  Fact Sheet for Healthcare Providers: SeriousBroker.it  This test is not yet approved or cleared by the [place] FDA and has been authorized for detection and/or diagnosis of SARS-CoV-2 by FDA under an Emergency Use Authorization (EUA). This EUA will remain in effect (meaning this test can be used) for the duration of the COVID-19 declaration under Section 564(b)(1) of the Act, 21 U.S.C. section 360bbb-3(b)(1), unless the authorization is terminated or revoked.  Performed at [facility], [address], [place], [place] [address]   Respiratory (~20 pathogens) panel by PCR     Status: None   Collection Time: 11/24/22  4:08 PM   Specimen: Nasopharyngeal Swab; Respiratory  Result Value Ref Range Status   Adenovirus NOT DETECTED NOT DETECTED Final   Coronavirus 229E NOT DETECTED NOT DETECTED Final    Comment: (NOTE) The Coronavirus on the Respiratory Panel, DOES NOT test for the novel  Coronavirus (2019 nCoV)    Coronavirus HKU1 NOT DETECTED NOT DETECTED Final   Coronavirus NL63 NOT DETECTED NOT DETECTED Final   Coronavirus OC43 NOT DETECTED NOT DETECTED Final   Metapneumovirus NOT DETECTED NOT DETECTED Final   Rhinovirus / Enterovirus NOT DETECTED NOT DETECTED Final   Influenza Donyel Nester NOT DETECTED NOT DETECTED Final   Influenza B NOT DETECTED NOT DETECTED Final   Parainfluenza Virus 1 NOT DETECTED NOT DETECTED Final   Parainfluenza Virus 2 NOT DETECTED NOT DETECTED Final   Parainfluenza Virus 3 NOT DETECTED NOT DETECTED Final   Parainfluenza Virus 4 NOT DETECTED NOT DETECTED Final   Respiratory Syncytial Virus NOT DETECTED NOT DETECTED Final   Bordetella pertussis NOT DETECTED NOT DETECTED Final   Bordetella Parapertussis NOT DETECTED NOT DETECTED Final   Chlamydophila pneumoniae NOT DETECTED NOT DETECTED Final   Mycoplasma pneumoniae NOT DETECTED NOT DETECTED Final    Comment: Performed at [facility], [address], [place], [place] [address]  MRSA Next Gen by PCR, Nasal     Status: None   Collection Time: 11/25/22  3:56 PM   Specimen: Nasal Mucosa; Nasal Swab  Result Value Ref Range Status   MRSA by PCR Next Gen NOT DETECTED  NOT DETECTED Final    Comment: (NOTE) The GeneXpert MRSA Assay (FDA approved for NASAL specimens only), is one component of Kenan Moodie comprehensive MRSA colonization surveillance program. It is not intended to diagnose MRSA infection nor to guide or monitor treatment for MRSA infections. Test performance is not FDA approved in patients less than 40  years old. Performed at [facility], [address], [place], [place] [address]   Expectorated Sputum Assessment w Gram Stain, Rflx to Resp Cult     Status: None   Collection Time: 11/25/22  3:56 PM   Specimen: Expectorated Sputum  Result Value Ref Range Status   Specimen Description EXPECTORATED SPUTUM  Final   Special Requests NONE  Final   Sputum evaluation   Final    THIS SPECIMEN IS ACCEPTABLE FOR SPUTUM CULTURE Performed at [facility], [address] [place], [place] [address]    Report Status 11/25/2022 FINAL  Final  Culture, Respiratory w Gram Stain     Status: None   Collection Time: 11/25/22  3:56 PM  Result Value Ref Range Status   Specimen Description   Final    EXPECTORATED SPUTUM Performed at [facility], [address] [place], [place] [address]    Special Requests   Final    NONE Reflexed from [id number] Performed at [facility], [address] [place], [place] [address]    Gram Stain   Final    ABUNDANT WBC PRESENT, PREDOMINANTLY PMN FEW GRAM NEGATIVE RODS RARE GRAM POSITIVE RODS RARE BUDDING YEAST SEEN    Culture   Final    Normal respiratory flora-no Staph aureus or Pseudomonas seen Performed at [facility], [address], [place], [place] [address]    Report Status 11/27/2022 FINAL  Final     Labs: Basic Metabolic Panel: Recent Labs  Lab 11/24/22 1150 11/25/22 0423 11/26/22 0419 11/27/22 0515  NA 131* 133* 132* 132*  K 3.2* 4.1 3.4* 3.9  CL 91* 98 98 99  CO2 27 25 25  21*  GLUCOSE 103* 108* 88 89  BUN 11 12 9 11   CREATININE 0.92 0.71 0.72 0.76  CALCIUM 8.9 8.5* 8.5* 9.0  MG  --   --  1.9 2.0  PHOS  --   --  3.9 5.3*   Liver Function Tests: Recent Labs  Lab 11/24/22 1150 11/26/22 0419  AST 25 18  ALT 35 27  ALKPHOS 103 82  BILITOT 0.8 0.5  PROT 9.2* 7.6  ALBUMIN 3.4* 3.0*   No results for input(s): "LIPASE", "AMYLASE" in the last 168  hours. No results for input(s): "AMMONIA" in the last 168 hours. CBC: Recent Labs  Lab 11/24/22 1150 11/25/22 0423 11/26/22 0419 11/27/22 0515  WBC 16.1* 22.3* 14.6* 11.3*  NEUTROABS 10.5*  --  7.4  --   HGB 13.0 10.4* 11.3* 12.0*  HCT 38.6* 31.6* 34.9* 36.9*  MCV 90.4 91.1 91.1 90.0  PLT 319 276 300 317   Cardiac Enzymes: No results for input(s): "CKTOTAL", "CKMB", "CKMBINDEX", "TROPONINI" in the last 168 hours. BNP: BNP (last 3 results) Recent Labs    11/24/22 1150 11/25/22 0423 11/26/22 0419  BNP 18.0 144.1* 49.2    ProBNP (last 3 results) No results for input(s): "PROBNP" in the last 8760 hours.  CBG: No results for input(s): "GLUCAP" in the last 168 hours.     Signed:  Lacretia Nicks MD.  [facility] 11/27/2022, 4:09 PM

## 2022-11-29 LAB — LEGIONELLA PNEUMOPHILA SEROGP 1 UR AG: L. pneumophila Serogp 1 Ur Ag: NEGATIVE

## 2022-12-27 DIAGNOSIS — J45909 Unspecified asthma, uncomplicated: Secondary | ICD-10-CM | POA: Insufficient documentation

## 2022-12-28 NOTE — Progress Notes (Signed)
Cardiology Office Note:    Date:  12/29/2022   ID:  Leroy Lindsey, DOB [birth year], MRN [id number]  PCP:  Patient, No Pcp Per  Cardiologist:  Leroy Herrlich, MD   Referring MD: Leroy Lindsey.,*  ASSESSMENT:    1. Sinus tachycardia   2. Hypertensive heart disease, unspecified whether heart failure present   3. Chronic obstructive pulmonary disease, unspecified COPD type ([name])    PLAN:    In order of problems listed above:  I would say in retrospect that his tachycardia during the hospitalization sinus was appropriate for the severity of his medical illness and exacerbation of COPD. Heart rates are nicely controlled in the office and at home with an Apple Watch and I think that the beta-blocker carvedilol is well-tolerated and appropriate for his hypertension which is well-controlled I do not feel he had heart failure I would continue his current thiazide diuretic and amlodipine. He is improved with his COPD and follows up with pulmonary in [facility]  Next appointment I reviewed his hospital records with the patient and his wife I do not think he needs to follow-up with me again as a cardiologist in the office.  It will   Medication Adjustments/Labs and Tests Ordered: Current medicines are reviewed at length with the patient today.  Concerns regarding medicines are outlined above.  No orders of the defined types were placed in this encounter.  No orders of the defined types were placed in this encounter.    Chief Complaint  Patient presents with   Tachycardia    History of Present Illness:    Leroy Lindsey is a 37 y.o. male with a history of hypertension who is being seen today for the evaluation of rapid heartbeat at the request of Leroy Lindsey.,*.  Recently admitted to [facility] with respiratory failure due to community-acquired pneumonia.  He was noted to have marked sinus tachycardia during the admission with rates in the 1 40-1 50 range  echocardiogram with a normal systolic function and moderate LVH and he had a CTA of the chest which showed no findings of pulmonary embolism there is notation he was started on carvedilol and instructed to follow-up with urology as an outpatient.  Other comments included elevated BNP level without findings of heart failure hypertension low cortisol and tobacco abuse.  CT of the chest showed normal cardiac vascular structures with diffuse interstitial and alveolar opacities bilaterally groundglass appearance.  EKG 11/25/2022 Hospital showed lead reversal creatinine of 1 aVL sinus tachycardia rate of 114 bpm there was a late transition and persistent S wave in the lateral precordial leads consider RVH.BNP level ranged between 18 and 144 certainly not in the rule in the range for heart failure.  His wife is present participates in evaluation decision making Since his last admission to the hospital he has markedly improved he exercises every day he gets up goes to the store and only has shortness of breath walking a normal distance and is not having significant bronchospasm or cough. He has no history of heart disease congenital rheumatic or atrial fibrillation Is using an apple smart watch and has had no high rate alerts and his heart rate tends to run in the 80s and his oxygen saturation 93 to 96% He has an appointment scheduled with pulmonary in the next week and is compliant with his bronchodilators He has had no chest pain edema orthopnea palpitation or syncope His EKG today shows sinus rhythm and is normal the previous EKG  clearly have lead reversal 1 and aVL His echocardiogram performed last admission at [facility] did not show findings of heart failure RV dysfunction. Past Medical History:  Diagnosis Date   Asthma    seasonal   Depression     Past Surgical History:  Procedure Laterality Date   TYMPANOSTOMY TUBE PLACEMENT     WISDOM TOOTH EXTRACTION      Current Medications: No  outpatient medications have been marked as taking for the 12/29/22 encounter (Office Visit) with Leroy Daub, MD.     Allergies:   Patient has no known allergies.   Social History   Socioeconomic History   Marital status: Married    Spouse name: Not on file   Number of children: Not on file   Years of education: Not on file   Highest education level: Not on file  Occupational History   Not on file  Tobacco Use   Smoking status: Some Days    Types: Cigarettes   Smokeless tobacco: Never  Vaping Use   Vaping Use: Never used  Substance and Sexual Activity   Alcohol use: No   Drug use: No   Sexual activity: Not on file  Other Topics Concern   Not on file  Social History Narrative   Not on file   Social Determinants of Health   Financial Resource Strain: Not on file  Food Insecurity: No Food Insecurity (11/27/2022)   Hunger Vital Sign    Worried About Running Out of Food in the Last Year: Never true    Ran Out of Food in the Last Year: Never true  Transportation Needs: No Transportation Needs (11/27/2022)   PRAPARE - Administrator, Civil Service (Medical): No    Lack of Transportation (Non-Medical): No  Physical Activity: Not on file  Stress: Not on file  Social Connections: Not on file     Family History: The patient's family history includes Hypertension in his mother.  ROS:   ROS Please see the history of present illness.     All other systems reviewed and are negative.  EKGs/Labs/Other Studies Reviewed:    The following studies were reviewed today:   Cardiac Studies & Procedures       ECHOCARDIOGRAM  ECHOCARDIOGRAM COMPLETE 11/25/2022  Narrative ECHOCARDIOGRAM REPORT    Patient Name:   Leroy Lindsey Date of Exam: 11/25/2022 Medical Rec #:  [id number]     Height:       68.0 in Accession #:    [id number]    Weight:       250.2 lb Date of Birth:  August 03, 1985      BSA:          2.248 m Patient Age:    36 years      BP:           160/101  mmHg Patient Gender: M             HR:           127 bpm. Exam Location:  Inpatient  Procedure: 2D Echo, Color Doppler and Cardiac Doppler  Indications:    Tachycardia  History:        Patient has no prior history of Echocardiogram examinations. Arrythmias:Tachycardia; Risk Factors:Current Smoker and Obesity. Sepsis d/t pneumonia.  Sonographer:    Milbert Coulter Referring Phys: [phone] A CALDWELL POWELL JR   Sonographer Comments: No subcostal window, suboptimal apical window and patient is obese. Image acquisition challenging due to patient body  habitus, Image acquisition challenging due to respiratory motion and tachycardia, and patient scanned in high Fowler position d/t pneumonia and difficulty breathing. IMPRESSIONS   1. Very tecnically difficult study due to poor windows, patient had difficulty breathing during the exam, unable to lay supine, Definity contrast not given 2. Left ventricular ejection fraction, by estimation, is 65 to 70%. The left ventricle has normal function. The left ventricle has no regional wall motion abnormalities. There is moderate left ventricular hypertrophy. Left ventricular diastolic parameters are indeterminate. 3. Right ventricular systolic function is normal. The right ventricular size is normal. 4. The mitral valve is grossly normal. No evidence of mitral valve regurgitation. 5. The aortic valve was not well visualized. Aortic valve regurgitation is not visualized. No aortic stenosis is present.  Comparison(s): No prior Echocardiogram.  FINDINGS Left Ventricle: Left ventricular ejection fraction, by estimation, is 65 to 70%. The left ventricle has normal function. The left ventricle has no regional wall motion abnormalities. The left ventricular internal cavity size was normal in size. There is moderate left ventricular hypertrophy. Left ventricular diastolic parameters are indeterminate.  Right Ventricle: The right ventricular size is normal. No  increase in right ventricular wall thickness. Right ventricular systolic function is normal.  Left Atrium: Left atrial size was not assessed.  Right Atrium: Right atrial size was not assessed.  Pericardium: There is no evidence of pericardial effusion.  Mitral Valve: The mitral valve is grossly normal. No evidence of mitral valve regurgitation.  Tricuspid Valve: The tricuspid valve is not well visualized. Tricuspid valve regurgitation is not demonstrated.  Aortic Valve: The aortic valve was not well visualized. Aortic valve regurgitation is not visualized. No aortic stenosis is present.  Pulmonic Valve: The pulmonic valve was not well visualized. Pulmonic valve regurgitation is not visualized.  Aorta: The aortic root and ascending aorta are structurally normal, with no evidence of dilitation.  Venous: The inferior vena cava was not well visualized.  IAS/Shunts: No atrial level shunt detected by color flow Doppler.   LEFT VENTRICLE PLAX 2D LVIDd:         3.80 cm LVIDs:         2.30 cm LV PW:         1.30 cm LV IVS:        1.30 cm LVOT diam:     2.10 cm LVOT Area:     3.46 cm   RIGHT VENTRICLE RV S prime:     20.50 cm/s TAPSE (M-mode): 2.6 cm  LEFT ATRIUM         Index LA diam:    3.60 cm 1.60 cm/m MITRAL VALVE MV Area (PHT): 5.31 cm    SHUNTS MV Decel Time: 143 msec    Systemic Diam: 2.10 cm MV E velocity: 68.10 cm/s MV A velocity: 63.00 cm/s MV E/A ratio:  1.08  Zoila Shutter MD Electronically signed by Zoila Shutter MD Signature Date/Time: 11/25/2022/4:35:29 PM    Final             EKG:  EKG is  ordered today.  The ekg ordered today is personally reviewed and demonstrates sinus rhythm with no findings of right atrial right ventricular enlargement or right ventricular strain.  Recent Labs: 11/26/2022: ALT 27; B Natriuretic Peptide 49.2 11/27/2022: BUN 11; Creatinine, Ser 0.76; Hemoglobin 12.0; Magnesium 2.0; Platelets 317; Potassium 3.9; Sodium 132; TSH 1.185   Recent Lipid Panel No results found for: "CHOL", "TRIG", "HDL", "CHOLHDL", "VLDL", "LDLCALC", "LDLDIRECT"  Physical Exam:    VS:  BP 130/80 (BP Location: Right Arm)   Pulse 85   Ht 5\' 9"  (1.753 m)   Wt 262 lb (118.8 kg)   SpO2 99%   BMI 38.69 kg/m     Wt Readings from Last 3 Encounters:  12/29/22 262 lb (118.8 kg)  11/24/22 250 lb 3.2 oz (113.5 kg)  04/04/18 260 lb (117.9 kg)     GEN:  Well nourished, well developed in no acute distress HEENT: Normal NECK: No JVD; No carotid bruits LYMPHATICS: No lymphadenopathy CARDIAC: RRR, no murmurs, rubs, gallops RESPIRATORY:  Clear to auscultation without rales, wheezing or rhonchi  ABDOMEN: Soft, non-tender, non-distended MUSCULOSKELETAL:  No edema; No deformity  SKIN: Warm and dry NEUROLOGIC:  Alert and oriented x 3 PSYCHIATRIC:  Normal affect     Signed, Leroy Herrlich, MD  12/29/2022 1:47 PM    [facility]

## 2022-12-29 ENCOUNTER — Ambulatory Visit: Payer: Medicaid Other | Attending: Cardiology | Admitting: Cardiology

## 2022-12-29 ENCOUNTER — Encounter: Payer: Self-pay | Admitting: Cardiology

## 2022-12-29 VITALS — BP 130/80 | HR 85 | Ht 69.0 in | Wt 262.0 lb

## 2022-12-29 DIAGNOSIS — J449 Chronic obstructive pulmonary disease, unspecified: Secondary | ICD-10-CM | POA: Diagnosis present

## 2022-12-29 DIAGNOSIS — I119 Hypertensive heart disease without heart failure: Secondary | ICD-10-CM

## 2022-12-29 DIAGNOSIS — R000 Tachycardia, unspecified: Secondary | ICD-10-CM

## 2022-12-29 NOTE — Patient Instructions (Signed)
Medication Instructions:  Your physician recommends that you continue on your current medications as directed. Please refer to the Current Medication list given to you today.  *If you need a refill on your cardiac medications before your next appointment, please call your pharmacy*   Lab Work: None If you have labs (blood work) drawn today and your tests are completely normal, you will receive your results only by: MyChart Message (if you have MyChart) OR A paper copy in the mail If you have any lab test that is abnormal or we need to change your treatment, we will call you to review the results.   Testing/Procedures: None   Follow-Up: At [facility], you and your health needs are our priority.  As part of our continuing mission to provide you with exceptional heart care, we have created designated Provider Care Teams.  These Care Teams include your primary Cardiologist (physician) and Advanced Practice Providers (APPs -  Physician Assistants and Nurse Practitioners) who all work together to provide you with the care you need, when you need it.  We recommend signing up for the patient portal called "MyChart".  Sign up information is provided on this After Visit Summary.  MyChart is used to connect with patients for Virtual Visits (Telemedicine).  Patients are able to view lab/test results, encounter notes, upcoming appointments, etc.  Non-urgent messages can be sent to your provider as well.   To learn more about what you can do with MyChart, go to [email or web address].    Your next appointment:   Follow up as needed  Provider:   Brian Munley, MD    Other Instructions None  

## 2023-01-04 ENCOUNTER — Encounter: Payer: Self-pay | Admitting: Pulmonary Disease

## 2023-01-04 ENCOUNTER — Ambulatory Visit (INDEPENDENT_AMBULATORY_CARE_PROVIDER_SITE_OTHER): Payer: Medicaid Other | Admitting: Pulmonary Disease

## 2023-01-04 VITALS — BP 134/88 | HR 85 | Ht 69.0 in | Wt 265.8 lb

## 2023-01-04 DIAGNOSIS — Z87891 Personal history of nicotine dependence: Secondary | ICD-10-CM | POA: Diagnosis not present

## 2023-01-04 DIAGNOSIS — J4520 Mild intermittent asthma, uncomplicated: Secondary | ICD-10-CM

## 2023-01-04 MED ORDER — FLUTICASONE-SALMETEROL 115-21 MCG/ACT IN AERO
2.0000 | INHALATION_SPRAY | Freq: Two times a day (BID) | RESPIRATORY_TRACT | 12 refills | Status: DC
Start: 2023-01-04 — End: 2023-08-17

## 2023-01-04 NOTE — Patient Instructions (Signed)
Start advair HFA inhaler 2 puffs twice dialy - rinse mouth out after each use  Continue to use albuterol inhaler 1-2 puffs every 4-6 hours  Follow up in 3 months with pulmonary function tests

## 2023-01-04 NOTE — Progress Notes (Signed)
Synopsis: Referred in June 2024 for hospital follow up  Subjective:   PATIENT ID: Leroy Lindsey GENDER: male DOB: 1986-03-28, MRN: [id number]  HPI  Chief Complaint  Patient presents with   Consult    Referred by ED for increased SOB with exertion. Was in the ED on 11/24/22. States he has been feeling well since being home.    Leroy Lindsey is a 37 year male, recent former smoker with obesity and asthma who is referred to pulmonary clinic for hospital follow up.   He was admitted 5/1 to 5/4 for sepsis due to pneumonia with acute hypoxemic respiratory failure.  He has been feeling well since hospitalization.   He was smoking 2 packs per day since he was 37 years old. He is not vaping. He is not smoking other drugs. He stopped drinking alcohol this year.   He has exertional dyspnea and cough. He has intermittent wheezing. He uses albuterol inhaler as needed with good relief. He will wake up occasionally due to cough, wheezing or dyspnea 3 times per week. It was every night before. He has snoring. He does have fatigue/daytime sleepiness.   No seasonal allergies. No GERD symptoms.   He had second hand smoke from his step dad. 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   Past Medical History:  Diagnosis Date   Asthma    seasonal   Depression      Family History  Problem Relation Age of Onset   Hypertension Mother      Social History   Socioeconomic History   Marital status: Married    Spouse name: Not on file   Number of children: Not on file   Years of education: Not on file   Highest education level: Not on file  Occupational History   Not on file  Tobacco Use   Smoking status: Former    Packs/day: 2.00    Years: 20.00    Additional pack years: 0.00    Total pack years: 40.00    Types: Cigarettes    Start date: 02/24/2000    Quit date: 11/24/2022    Years since quitting: 0.1    Smokeless tobacco: Never  Vaping Use   Vaping Use: Never used  Substance and Sexual Activity   Alcohol use: No   Drug use: No   Sexual activity: Not on file  Other Topics Concern   Not on file  Social History Narrative   Not on file   Social Determinants of Health   Financial Resource Strain: Not on file  Food Insecurity: No Food Insecurity (11/27/2022)   Hunger Vital Sign    Worried About Running Out of Food in the Last Year: Never true    Ran Out of Food in the Last Year: Never true  Transportation Needs: No Transportation Needs (11/27/2022)   PRAPARE - Administrator, Civil Service (Medical): No    Lack of Transportation (Non-Medical): No  Physical Activity: Not on file  Stress: Not on file  Social Connections: Not on file  Intimate Partner Violence: Not At Risk (11/27/2022)   Humiliation, Afraid, Rape, and Kick questionnaire    Fear of Current or Ex-Partner: No    Emotionally Abused: No    Physically Abused: No    Sexually Abused: No     No Known Allergies   Outpatient Medications Prior to Visit  Medication Sig Dispense  Refill   ALLEGRA ALLERGY 180 MG tablet Take 180 mg by mouth daily.     carvedilol (COREG) 6.25 MG tablet Take 2 tablets (12.5 mg total) by mouth 2 (two) times daily with a meal. 240 tablet 1   chlorpheniramine-HYDROcodone (TUSSIONEX) 10-8 MG/5ML Take 5 mLs by mouth 2 (two) times daily.     MUCINEX FAST-MAX 10-650-400 MG/20ML LIQD Take 10 mLs by mouth every 6 (six) hours as needed (for coughing).     ondansetron (ZOFRAN-ODT) 4 MG disintegrating tablet Take 4 mg by mouth every 8 (eight) hours as needed for nausea or vomiting (dissolve orally).     VENTOLIN HFA 108 (90 Base) MCG/ACT inhaler Inhale 2 puffs into the lungs every 4 (four) hours as needed for wheezing or shortness of breath.     amLODipine (NORVASC) 5 MG tablet Take 1 tablet (5 mg total) by mouth daily. 30 tablet 1   hydrochlorothiazide (MICROZIDE) 12.5 MG capsule Take 1 capsule (12.5 mg  total) by mouth daily. 30 capsule 1   potassium chloride SA (KLOR-CON M) 10 MEQ tablet Take 1 tablet (10 mEq total) by mouth daily. Follow repeat labs within 1 week to see if the dose needs to be adjusted 30 tablet 0   No facility-administered medications prior to visit.   Review of Systems  Constitutional:  Negative for chills, fever, malaise/fatigue and weight loss.  HENT:  Negative for congestion, sinus pain and sore throat.   Eyes: Negative.   Respiratory:  Positive for cough, sputum production, shortness of breath and wheezing. Negative for hemoptysis.   Cardiovascular:  Negative for chest pain, palpitations, orthopnea, claudication and leg swelling.  Gastrointestinal:  Negative for abdominal pain, heartburn, nausea and vomiting.  Genitourinary: Negative.   Musculoskeletal:  Negative for joint pain and myalgias.  Skin:  Negative for rash.  Neurological:  Positive for headaches. Negative for weakness.  Endo/Heme/Allergies: Negative.   Psychiatric/Behavioral: Negative.     Objective:   Vitals:   01/04/23 1415  BP: 134/88  Pulse: 85  SpO2: 98%  Weight: 265 lb 12.8 oz (120.6 kg)  Height: 5\' 9"  (1.753 m)   Physical Exam Constitutional:      General: He is not in acute distress.    Appearance: He is obese.  HENT:     Head: Normocephalic and atraumatic.  Eyes:     Conjunctiva/sclera: Conjunctivae normal.  Cardiovascular:     Rate and Rhythm: Normal rate and regular rhythm.     Pulses: Normal pulses.     Heart sounds: Normal heart sounds. No murmur heard. Musculoskeletal:     Right lower leg: No edema.     Left lower leg: No edema.  Lymphadenopathy:     Cervical: No cervical adenopathy.  Skin:    General: Skin is warm and dry.  Neurological:     General: No focal deficit present.     Mental Status: He is alert.    CBC    Component Value Date/Time   WBC 11.3 (H) 11/27/2022 0515   RBC 4.10 (L) 11/27/2022 0515   HGB 12.0 (L) 11/27/2022 0515   HCT 36.9 (L)  11/27/2022 0515   PLT 317 11/27/2022 0515   MCV 90.0 11/27/2022 0515   MCH 29.3 11/27/2022 0515   MCHC 32.5 11/27/2022 0515   RDW 13.9 11/27/2022 0515   LYMPHSABS 5.7 (H) 11/26/2022 0419   MONOABS 1.3 (H) 11/26/2022 0419   EOSABS 0.1 11/26/2022 0419   BASOSABS 0.0 11/26/2022 0419      Latest  Ref Rng & Units 11/27/2022    5:15 AM 11/26/2022    4:19 AM 11/25/2022    4:23 AM  BMP  Glucose 70 - 99 mg/dL 89  88  161   BUN 6 - 20 mg/dL 11  9  12    Creatinine 0.61 - 1.24 mg/dL 0.96  0.45  4.09   Sodium 135 - 145 mmol/L 132  132  133   Potassium 3.5 - 5.1 mmol/L 3.9  3.4  4.1   Chloride 98 - 111 mmol/L 99  98  98   CO2 22 - 32 mmol/L 21  25  25    Calcium 8.9 - 10.3 mg/dL 9.0  8.5  8.5    Chest imaging: CTA Chest 11/24/22 1. No evidence of pulmonary embolism. 2. Diffuse bilateral predominantly ground-glass airspace opacities. Findings are favored to represent multifocal pneumonia although pulmonary edema is also a consideration. 3. Mildly enlarged mediastinal and right hilar lymph nodes, likely reactive. 4. Prominent right-sided gynecomastia.  PFT:     No data to display          Labs:  Path:  Echo:  Heart Catheterization:    Sleep Study 2006 IMPRESSION/RECOMMENDATION: 1. No sleep-disordered breathing, with only mild snoring and normal oxygenation. This is within normal limits. 2. History suggests sleep schedule disturbance likely to reflect depression, poor sleep hygiene, and possibly a desire to use sleep to avoid scheduled daytime activities. Accuracy and appropriateness of this evaluation can best be determined by the primary physician.   Assessment & Plan:   Mild intermittent asthma without complication - Plan: fluticasone-salmeterol (ADVAIR HFA) 115-21 MCG/ACT inhaler, Pulmonary Function Test  Former smoker  Discussion: Leroy Lindsey is a 37 year male, recent former smoker with obesity and asthma who is referred to pulmonary clinic for hospital follow up.    He is doing much better since his hospitalization. He appears to have mild to moderate asthma given his need for intermittent albuterol and night time awakenings 3 times per week.  He is to start advair HFA 115-61mcg 2 puffs twice daily and continue as needed albuterol 1-2 puffs.   I commended him on quitting smoking.   Follow up in 3 months with PFTs.   Melody Comas, MD [facility] Office: [phone]   Current Outpatient Medications:    ALLEGRA ALLERGY 180 MG tablet, Take 180 mg by mouth daily., Disp: , Rfl:    carvedilol (COREG) 6.25 MG tablet, Take 2 tablets (12.5 mg total) by mouth 2 (two) times daily with a meal., Disp: 240 tablet, Rfl: 1   chlorpheniramine-HYDROcodone (TUSSIONEX) 10-8 MG/5ML, Take 5 mLs by mouth 2 (two) times daily., Disp: , Rfl:    fluticasone-salmeterol (ADVAIR HFA) 115-21 MCG/ACT inhaler, Inhale 2 puffs into the lungs 2 (two) times daily., Disp: 1 each, Rfl: 12   MUCINEX FAST-MAX 10-650-400 MG/20ML LIQD, Take 10 mLs by mouth every 6 (six) hours as needed (for coughing)., Disp: , Rfl:    ondansetron (ZOFRAN-ODT) 4 MG disintegrating tablet, Take 4 mg by mouth every 8 (eight) hours as needed for nausea or vomiting (dissolve orally)., Disp: , Rfl:    VENTOLIN HFA 108 (90 Base) MCG/ACT inhaler, Inhale 2 puffs into the lungs every 4 (four) hours as needed for wheezing or shortness of breath., Disp: , Rfl:    amLODipine (NORVASC) 5 MG tablet, Take 1 tablet (5 mg total) by mouth daily., Disp: 30 tablet, Rfl: 1   hydrochlorothiazide (MICROZIDE) 12.5 MG capsule, Take 1 capsule (12.5 mg  total) by mouth daily., Disp: 30 capsule, Rfl: 1   potassium chloride SA (KLOR-CON M) 10 MEQ tablet, Take 1 tablet (10 mEq total) by mouth daily. Follow repeat labs within 1 week to see if the dose needs to be adjusted, Disp: 30 tablet, Rfl: 0

## 2023-03-29 ENCOUNTER — Encounter (HOSPITAL_COMMUNITY): Payer: Self-pay | Admitting: Emergency Medicine

## 2023-03-29 ENCOUNTER — Other Ambulatory Visit: Payer: Self-pay

## 2023-03-29 ENCOUNTER — Emergency Department (HOSPITAL_COMMUNITY)
Admission: EM | Admit: 2023-03-29 | Discharge: 2023-03-29 | Disposition: A | Discharge status: Home / Self Care | Payer: MEDICAID | Attending: Emergency Medicine | Admitting: Emergency Medicine

## 2023-03-29 DIAGNOSIS — U071 COVID-19: Secondary | ICD-10-CM | POA: Insufficient documentation

## 2023-03-29 DIAGNOSIS — J9801 Acute bronchospasm: Secondary | ICD-10-CM

## 2023-03-29 DIAGNOSIS — R059 Cough, unspecified: Secondary | ICD-10-CM | POA: Diagnosis present

## 2023-03-29 LAB — RESP PANEL BY RT-PCR (RSV, FLU A&B, COVID)  RVPGX2
Influenza A by PCR: NEGATIVE
Influenza B by PCR: NEGATIVE
Resp Syncytial Virus by PCR: NEGATIVE
SARS Coronavirus 2 by RT PCR: POSITIVE — AB

## 2023-03-29 MED ORDER — BENZONATATE 100 MG PO CAPS: 100.0000 mg | ORAL_CAPSULE | Freq: Three times a day (TID) | ORAL | 0 refills | Status: DC

## 2023-03-29 MED ORDER — METHYLPREDNISOLONE 4 MG PO TBPK: ORAL_TABLET | ORAL | 0 refills | Status: DC

## 2023-03-29 NOTE — Discharge Instructions (Signed)
Take your inhaler 2 puffs every four hours for the next 4-5 days. .Get help right away if: You have trouble breathing or get short of breath. You have pain or pressure in your chest. You cannot speak or move any part of your body. You are confused. Your symptoms get worse. These symptoms may be an emergency. Get help right away. Call 911.

## 2023-03-29 NOTE — ED Provider Notes (Signed)
EMERGENCY DEPARTMENT AT [facility] Provider Note   CSN: [id number] Arrival date & time: 03/29/23  1231     History  Chief Complaint  Patient presents with   Fever   Sore Throat   Cough    Leroy Lindsey is a 37 y.o. male who is emergency department chief complaint of flulike symptoms x 5 days.  He had fever cough and sore throat.  Patient has a history of asthma.  He took Vicks daytime medication without relief of his symptoms.  He denies any wheezing.   Fever Associated symptoms: cough   Sore Throat  Cough Associated symptoms: fever        Home Medications Prior to Admission medications   Medication Sig Start Date End Date Taking? Authorizing Provider  [facility] ALLERGY 180 MG tablet Take 180 mg by mouth daily.    [provider]  amLODipine (NORVASC) 5 MG tablet Take 1 tablet (5 mg total) by mouth daily. 11/28/22 12/28/22  Zigmund Daniel., MD  carvedilol (COREG) 6.25 MG tablet Take 2 tablets (12.5 mg total) by mouth 2 (two) times daily with a meal. 11/27/22 03/27/23  Zigmund Daniel., MD  chlorpheniramine-HYDROcodone (TUSSIONEX) 10-8 MG/5ML Take 5 mLs by mouth 2 (two) times daily.    [provider]  fluticasone-salmeterol (ADVAIR HFA) 115-21 MCG/ACT inhaler Inhale 2 puffs into the lungs 2 (two) times daily. 01/04/23   Martina Sinner, MD  hydrochlorothiazide (MICROZIDE) 12.5 MG capsule Take 1 capsule (12.5 mg total) by mouth daily. 11/27/22 12/27/22  Zigmund Daniel., MD  MUCINEX FAST-MAX [phone] MG/20ML LIQD Take 10 mLs by mouth every 6 (six) hours as needed (for coughing).    [provider]  ondansetron (ZOFRAN-ODT) 4 MG disintegrating tablet Take 4 mg by mouth every 8 (eight) hours as needed for nausea or vomiting (dissolve orally).    [provider]  potassium chloride SA (KLOR-CON M) 10 MEQ tablet Take 1 tablet (10 mEq total) by mouth daily. Follow repeat labs within 1 week to see if the dose needs  to be adjusted 11/28/22 12/28/22  Zigmund Daniel., MD  VENTOLIN HFA [phone] Base) MCG/ACT inhaler Inhale 2 puffs into the lungs every 4 (four) hours as needed for wheezing or shortness of breath.    [provider]      Allergies    Patient has no known allergies.    Review of Systems   Review of Systems  Constitutional:  Positive for fever.  Respiratory:  Positive for cough.     Physical Exam Updated Vital Signs BP (!) 166/92 (BP Location: Left Arm)   Pulse 87   Temp 98.7 F (37.1 C) (Oral)   Resp 20   Ht 5\' 9"  (1.753 m)   Wt 120.6 kg   SpO2 96%   BMI 39.26 kg/m  Physical Exam Vitals and nursing note reviewed.  Constitutional:      General: He is not in acute distress.    Appearance: He is well-developed. He is not diaphoretic.  HENT:     Head: Normocephalic and atraumatic.  Eyes:     General: No scleral icterus.    Conjunctiva/sclera: Conjunctivae normal.  Cardiovascular:     Rate and Rhythm: Normal rate and regular rhythm.     Heart sounds: Normal heart sounds.  Pulmonary:     Effort: Pulmonary effort is normal. No respiratory distress.     Breath sounds: Wheezing present.  Abdominal:  Palpations: Abdomen is soft.     Tenderness: There is no abdominal tenderness.  Musculoskeletal:     Cervical back: Normal range of motion and neck supple.  Skin:    General: Skin is warm and dry.  Neurological:     Mental Status: He is alert.  Psychiatric:        Behavior: Behavior normal.     ED Results / Procedures / Treatments   Labs (all labs ordered are listed, but only abnormal results are displayed) Labs Reviewed  RESP PANEL BY RT-PCR (RSV, FLU A&B, COVID)  [id number] - Abnormal; Notable for the following components:      Result Value   SARS Coronavirus 2 by RT PCR POSITIVE (*)    All other components within normal limits    EKG None  Radiology No results found.  Procedures Procedures    Medications Ordered in ED Medications - No data to  display  ED Course/ Medical Decision Making/ A&P                                 Medical Decision Making  Patient with mild exp wheezing and bronchitic cough. Normal air movement. He has an inhaler. Oput of the window for antivirals. D/x with medrol. Scheduled inhlaer Q 4 hours and tessalon. Given return precautions.        Final Clinical Impression(s) / ED Diagnoses Final diagnoses:  None    Rx / DC Orders ED Discharge Orders     None         Arthor Captain, PA-C 03/29/23 1457    Gerhard Munch, MD 03/31/23 1116

## 2023-03-29 NOTE — ED Triage Notes (Signed)
Pt arrived POV c/o flu-like symptoms since Friday, pt reports he has taken otc meds for these symptoms but no relief. Pt denies SOB.

## 2023-04-18 ENCOUNTER — Ambulatory Visit (INDEPENDENT_AMBULATORY_CARE_PROVIDER_SITE_OTHER): Payer: MEDICAID | Admitting: Pulmonary Disease

## 2023-04-18 ENCOUNTER — Ambulatory Visit: Payer: Medicaid Other | Admitting: Pulmonary Disease

## 2023-04-18 ENCOUNTER — Encounter: Payer: Self-pay | Admitting: Pulmonary Disease

## 2023-04-18 VITALS — BP 118/82 | HR 101 | Temp 97.9°F | Ht 70.5 in | Wt 267.0 lb

## 2023-04-18 DIAGNOSIS — R0683 Snoring: Secondary | ICD-10-CM

## 2023-04-18 DIAGNOSIS — J439 Emphysema, unspecified: Secondary | ICD-10-CM

## 2023-04-18 DIAGNOSIS — F17210 Nicotine dependence, cigarettes, uncomplicated: Secondary | ICD-10-CM

## 2023-04-18 DIAGNOSIS — Z6837 Body mass index (BMI) 37.0-37.9, adult: Secondary | ICD-10-CM

## 2023-04-18 DIAGNOSIS — J984 Other disorders of lung: Secondary | ICD-10-CM

## 2023-04-18 DIAGNOSIS — Z23 Encounter for immunization: Secondary | ICD-10-CM | POA: Diagnosis not present

## 2023-04-18 DIAGNOSIS — J4520 Mild intermittent asthma, uncomplicated: Secondary | ICD-10-CM

## 2023-04-18 LAB — PULMONARY FUNCTION TEST
DL/VA % pred: 101 %
DL/VA: 4.77 ml/min/mmHg/L
DLCO cor % pred: 56 %
DLCO cor: 17.98 ml/min/mmHg
DLCO unc % pred: 56 %
DLCO unc: 17.98 ml/min/mmHg
FEF 25-75 Post: 0.79 L/sec
FEF 25-75 Pre: 2.43 L/sec
FEF2575-%Change-Post: -67 %
FEF2575-%Pred-Post: 19 %
FEF2575-%Pred-Pre: 58 %
FEV1-%Change-Post: -52 %
FEV1-%Pred-Post: 27 %
FEV1-%Pred-Pre: 57 %
FEV1-Post: 1.19 L
FEV1-Pre: 2.5 L
FEV1FVC-%Change-Post: -46 %
FEV1FVC-%Pred-Pre: 103 %
FEV6-%Change-Post: -12 %
FEV6-%Pred-Post: 49 %
FEV6-%Pred-Pre: 56 %
FEV6-Post: 2.63 L
FEV6-Pre: 3.01 L
FEV6FVC-%Pred-Post: 102 %
FEV6FVC-%Pred-Pre: 102 %
FVC-%Change-Post: -11 %
FVC-%Pred-Post: 49 %
FVC-%Pred-Pre: 55 %
FVC-Post: 2.67 L
FVC-Pre: 3.01 L
Post FEV1/FVC ratio: 45 %
Post FEV6/FVC ratio: 100 %
Pre FEV1/FVC ratio: 83 %
Pre FEV6/FVC Ratio: 100 %
RV % pred: 99 %
RV: 1.81 L
TLC % pred: 70 %
TLC: 4.92 L

## 2023-04-18 NOTE — Patient Instructions (Signed)
Full PFT performed today. °

## 2023-04-18 NOTE — Progress Notes (Signed)
Full PFT performed today. °

## 2023-04-18 NOTE — Progress Notes (Signed)
Synopsis: Referred in June 2024 for hospital follow up  Subjective:   PATIENT ID: Leroy Lindsey GENDER: male DOB: 1986-07-07, MRN: [id number]  HPI  Chief Complaint  Patient presents with   Follow-up    PFT done today. Breathing is some better since the last visit. He is using his albuterol inhaler 3 x daily on average.    Leroy Lindsey is a 37 year male, recent former smoker with obesity and asthma who returns pulmonary clinic for asthma.  Initial OV 01/04/23 He was admitted 5/1 to 5/4 for sepsis due to pneumonia with acute hypoxemic respiratory failure.  He has been feeling well since hospitalization.   He was smoking 2 packs per day since he was 37 years old. He is not vaping. He is not smoking other drugs. He stopped drinking alcohol this year.   He has exertional dyspnea and cough. He has intermittent wheezing. He uses albuterol inhaler as needed with good relief. He will wake up occasionally due to cough, wheezing or dyspnea 3 times per week. It was every night before. He has snoring. He does have fatigue/daytime sleepiness.   No seasonal allergies. No GERD symptoms.   He had second hand smoke from his step dad. 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    Today OV 04/18/23 He reports feeling better since last visit and has been doing well on advair 115-83mcg 2 puffs twice daily. He has resumed smoking and is smoking 1 pack per week. He has intermittent wheezing. He has issues with snoring at night and intermittently wakes up gasping or with dyspnea.   PFTs today show mild restriction and mild diffusion defect. There is curvature of the expiratory flow/volume limb concerning for obstruction.   Past Medical History:  Diagnosis Date   Asthma    seasonal   Depression      Family History  Problem Relation Age of Onset   Hypertension Mother      Social History   Socioeconomic  History   Marital status: Married    Spouse name: Not on file   Number of children: Not on file   Years of education: Not on file   Highest education level: Not on file  Occupational History   Not on file  Tobacco Use   Smoking status: Some Days    Current packs/day: 0.00    Average packs/day: 2.0 packs/day for 22.7 years (45.5 ttl pk-yrs)    Types: Cigarettes    Start date: 02/24/2000    Last attempt to quit: 11/24/2022    Years since quitting: 0.3   Smokeless tobacco: Never  Vaping Use   Vaping status: Never Used  Substance and Sexual Activity   Alcohol use: No   Drug use: No   Sexual activity: Not on file  Other Topics Concern   Not on file  Social History Narrative   Not on file   Social Determinants of Health   Financial Resource Strain: Not on file  Food Insecurity: No Food Insecurity (11/27/2022)   Hunger Vital Sign    Worried About Running Out of Food in the Last Year: Never true    Ran Out of Food in the Last Year: Never true  Transportation Needs: No Transportation Needs (11/27/2022)   PRAPARE - Administrator, Civil Service (Medical): No    Lack of Transportation (Non-Medical): No  Physical Activity: Not on file  Stress: Not on file  Social Connections: Unknown (12/27/2022)   Received from [facility], [facility]   Social Network    Social Network: Not on file  Intimate Partner Violence: Unknown (12/27/2022)   Received from [facility], [facility]   HITS    Physically Hurt: Not on file    Insult or Talk Down To: Not on file    Threaten Physical Harm: Not on file    Scream or Curse: Not on file     No Known Allergies   Outpatient Medications Prior to Visit  Medication Sig Dispense Refill   acetaminophen (TYLENOL) 325 MG tablet Take 650 mg by mouth every 6 (six) hours as needed.     ALLEGRA ALLERGY 180 MG tablet Take 180 mg by mouth daily as needed.     fluticasone-salmeterol (ADVAIR HFA) 115-21 MCG/ACT inhaler Inhale 2 puffs into the  lungs 2 (two) times daily. 1 each 12   VENTOLIN HFA 108 (90 Base) MCG/ACT inhaler Inhale 2 puffs into the lungs every 4 (four) hours as needed for wheezing or shortness of breath.     amLODipine (NORVASC) 5 MG tablet Take 1 tablet (5 mg total) by mouth daily. 30 tablet 1   benzonatate (TESSALON) 100 MG capsule Take 1 capsule (100 mg total) by mouth every 8 (eight) hours. 21 capsule 0   carvedilol (COREG) 6.25 MG tablet Take 2 tablets (12.5 mg total) by mouth 2 (two) times daily with a meal. 240 tablet 1   chlorpheniramine-HYDROcodone (TUSSIONEX) 10-8 MG/5ML Take 5 mLs by mouth 2 (two) times daily.     hydrochlorothiazide (MICROZIDE) 12.5 MG capsule Take 1 capsule (12.5 mg total) by mouth daily. 30 capsule 1   methylPREDNISolone (MEDROL DOSEPAK) 4 MG TBPK tablet Use as directed 21 tablet 0   MUCINEX FAST-MAX 10-650-400 MG/20ML LIQD Take 10 mLs by mouth every 6 (six) hours as needed (for coughing).     ondansetron (ZOFRAN-ODT) 4 MG disintegrating tablet Take 4 mg by mouth every 8 (eight) hours as needed for nausea or vomiting (dissolve orally).     potassium chloride SA (KLOR-CON M) 10 MEQ tablet Take 1 tablet (10 mEq total) by mouth daily. Follow repeat labs within 1 week to see if the dose needs to be adjusted 30 tablet 0   No facility-administered medications prior to visit.   Review of Systems  Constitutional:  Negative for chills, fever, malaise/fatigue and weight loss.  HENT:  Negative for congestion, sinus pain and sore throat.   Eyes: Negative.   Respiratory:  Positive for wheezing. Negative for cough, hemoptysis, sputum production and shortness of breath.   Cardiovascular:  Negative for chest pain, palpitations, orthopnea, claudication and leg swelling.  Gastrointestinal:  Negative for abdominal pain, heartburn, nausea and vomiting.  Genitourinary: Negative.   Musculoskeletal:  Negative for joint pain and myalgias.  Skin:  Negative for rash.  Neurological:  Negative for weakness.   Endo/Heme/Allergies: Negative.   Psychiatric/Behavioral: Negative.     Objective:   Vitals:   04/18/23 1430  BP: 118/82  Pulse: (!) 101  Temp: 97.9 F (36.6 C)  TempSrc: Oral  SpO2: 99%  Weight: 267 lb (121.1 kg)  Height: 5' 10.5" (1.791 m)   Physical Exam Constitutional:      General: He is not in acute distress.    Appearance: He is obese.  HENT:     Head: Normocephalic and atraumatic.  Eyes:     Conjunctiva/sclera: Conjunctivae normal.  Cardiovascular:     Rate  and Rhythm: Normal rate and regular rhythm.     Pulses: Normal pulses.     Heart sounds: Normal heart sounds. No murmur heard. Pulmonary:     Effort: Pulmonary effort is normal.     Breath sounds: Normal breath sounds.  Musculoskeletal:     Right lower leg: No edema.     Left lower leg: No edema.  Skin:    General: Skin is warm and dry.  Neurological:     Mental Status: He is alert.    CBC    Component Value Date/Time   WBC 11.3 (H) 11/27/2022 0515   RBC 4.10 (L) 11/27/2022 0515   HGB 12.0 (L) 11/27/2022 0515   HCT 36.9 (L) 11/27/2022 0515   PLT 317 11/27/2022 0515   MCV 90.0 11/27/2022 0515   MCH 29.3 11/27/2022 0515   MCHC 32.5 11/27/2022 0515   RDW 13.9 11/27/2022 0515   LYMPHSABS 5.7 (H) 11/26/2022 0419   MONOABS 1.3 (H) 11/26/2022 0419   EOSABS 0.1 11/26/2022 0419   BASOSABS 0.0 11/26/2022 0419      Latest Ref Rng & Units 11/27/2022    5:15 AM 11/26/2022    4:19 AM 11/25/2022    4:23 AM  BMP  Glucose 70 - 99 mg/dL 89  88  573   BUN 6 - 20 mg/dL 11  9  12    Creatinine 0.61 - 1.24 mg/dL 2.20  2.54  2.70   Sodium 135 - 145 mmol/L 132  132  133   Potassium 3.5 - 5.1 mmol/L 3.9  3.4  4.1   Chloride 98 - 111 mmol/L 99  98  98   CO2 22 - 32 mmol/L 21  25  25    Calcium 8.9 - 10.3 mg/dL 9.0  8.5  8.5    Chest imaging: CTA Chest 11/24/22 1. No evidence of pulmonary embolism. 2. Diffuse bilateral predominantly ground-glass airspace opacities. Findings are favored to represent multifocal  pneumonia although pulmonary edema is also a consideration. 3. Mildly enlarged mediastinal and right hilar lymph nodes, likely reactive. 4. Prominent right-sided gynecomastia.  PFT:    Latest Ref Rng & Units 04/18/2023   11:43 AM  PFT Results  FVC-Pre L 3.01  P  FVC-Predicted Pre % 55  P  FVC-Post L 2.67  P  FVC-Predicted Post % 49  P  Pre FEV1/FVC % % 83  P  Post FEV1/FCV % % 45  P  FEV1-Pre L 2.50  P  FEV1-Predicted Pre % 57  P  FEV1-Post L 1.19  P  DLCO uncorrected ml/min/mmHg 17.98  P  DLCO UNC% % 56  P  DLCO corrected ml/min/mmHg 17.98  P  DLCO COR %Predicted % 56  P  DLVA Predicted % 101  P  TLC L 4.92  P  TLC % Predicted % 70  P  RV % Predicted % 99  P    P Preliminary result    Labs:  Path:  Echo:  Heart Catheterization:    Sleep Study 2006 IMPRESSION/RECOMMENDATION: 1. No sleep-disordered breathing, with only mild snoring and normal oxygenation. This is within normal limits. 2. History suggests sleep schedule disturbance likely to reflect depression, poor sleep hygiene, and possibly a desire to use sleep to avoid scheduled daytime activities. Accuracy and appropriateness of this evaluation can best be determined by the primary physician.   Assessment & Plan:   Mixed restrictive and obstructive lung disease ([name]) - Plan: CT CHEST HIGH RESOLUTION, CANCELED: Alpha-1 antitrypsin phenotype, CANCELED: Alpha-1-Antitrypsin  Snoring - Plan: Home sleep test  BMI 37.0-37.9, adult - Plan: Home sleep test  Need for influenza vaccination - Plan: Flu vaccine trivalent PF, 6mos and older(Flulaval,Afluria,Fluarix,Fluzone)  Discussion: Leroy Lindsey is a 37 year male, recent former smoker with obesity and asthma who returns to pulmonary clinic for follow up.   He is to continue advair HFA 115-33mcg 2 puffs twice daily and continue as needed albuterol 1-2 puffs.   Recommend smoking cessation with nicotine patch 7mg  daily and to use mini nicotine lozenges as  needed.  We will check a HRCT chest given his restrictive defect on PFTs and to follow up for resolution of his pneumonia in May. Given his smoking history, RB-ILD may be of concern.   Follow up in 4 months.  Melody Comas, MD [facility] Office: [phone]   Current Outpatient Medications:    acetaminophen (TYLENOL) 325 MG tablet, Take 650 mg by mouth every 6 (six) hours as needed., Disp: , Rfl:    ALLEGRA ALLERGY 180 MG tablet, Take 180 mg by mouth daily as needed., Disp: , Rfl:    fluticasone-salmeterol (ADVAIR HFA) 115-21 MCG/ACT inhaler, Inhale 2 puffs into the lungs 2 (two) times daily., Disp: 1 each, Rfl: 12   VENTOLIN HFA 108 (90 Base) MCG/ACT inhaler, Inhale 2 puffs into the lungs every 4 (four) hours as needed for wheezing or shortness of breath., Disp: , Rfl:

## 2023-04-18 NOTE — Patient Instructions (Addendum)
We will check a repeat CT Chest scan   Continue advair HFA 115-68mcg 2 puffs twice daily - rinse mouth out after each use  Use albuterol inhaler 1-2 puffs every 4-6 hours as needed  We will schedule you for home sleep study to check for sleep apnea  Recommend using nicotine patches 7mg  daily and as needed mini nicotine lozenges to help quit smoking. Call 1-800-quit-NOW to get free nicotine replacement and counseling from the state of [place]    Follow up in 4 months

## 2023-04-20 ENCOUNTER — Ambulatory Visit: Payer: MEDICAID

## 2023-04-20 DIAGNOSIS — R0683 Snoring: Secondary | ICD-10-CM

## 2023-04-20 DIAGNOSIS — G4733 Obstructive sleep apnea (adult) (pediatric): Secondary | ICD-10-CM | POA: Diagnosis not present

## 2023-04-20 DIAGNOSIS — Z6837 Body mass index (BMI) 37.0-37.9, adult: Secondary | ICD-10-CM

## 2023-04-27 DIAGNOSIS — G4733 Obstructive sleep apnea (adult) (pediatric): Secondary | ICD-10-CM | POA: Diagnosis not present

## 2023-04-29 ENCOUNTER — Ambulatory Visit (HOSPITAL_COMMUNITY)
Admission: RE | Admit: 2023-04-29 | Discharge: 2023-04-29 | Disposition: A | Discharge status: Home / Self Care | Payer: MEDICAID | Source: Ambulatory Visit | Attending: Pulmonary Disease | Admitting: Pulmonary Disease

## 2023-04-29 DIAGNOSIS — J984 Other disorders of lung: Secondary | ICD-10-CM | POA: Diagnosis present

## 2023-04-29 DIAGNOSIS — J439 Emphysema, unspecified: Secondary | ICD-10-CM | POA: Insufficient documentation

## 2023-05-20 ENCOUNTER — Telehealth: Payer: Self-pay | Admitting: Pulmonary Disease

## 2023-05-20 NOTE — Telephone Encounter (Signed)
Patent's mother would like to know the results of her son's sleep study and CT scan. Call back number [phone]

## 2023-05-28 NOTE — Telephone Encounter (Signed)
Sleep study shows mild obstructive sleep apnea, we can consider starting CPAP therapy but would recommend trial of weight loss first.  Dr. Francine Graven

## 2023-05-28 NOTE — Telephone Encounter (Signed)
CT Chest results 1. Mild peribronchovascular ground-glass in the lower lobes, nonspecific and possibly postinfectious/postinflammatory in etiology. No evidence of fibrotic interstitial lung disease. 2. Air trapping is indicative of small airways disease.  He has mild non-specific inflammation of the lower parts of his lungs and retaining of too much air after breathing out which is consistent with his breathing tests that show obstruction.   No changes in his treatment regimen. Needs to continue inhalers and quit smoking.  Dr. Francine Graven

## 2023-05-30 NOTE — Telephone Encounter (Signed)
Lm x1 for patient with unknown male.

## 2023-05-31 NOTE — Telephone Encounter (Signed)
Patient tried to return missed call.

## 2023-06-01 NOTE — Telephone Encounter (Signed)
Patient informed results of sleep study and recommendations.

## 2023-08-17 ENCOUNTER — Encounter: Payer: Self-pay | Admitting: Pulmonary Disease

## 2023-08-17 ENCOUNTER — Ambulatory Visit (INDEPENDENT_AMBULATORY_CARE_PROVIDER_SITE_OTHER): Payer: MEDICAID | Admitting: Pulmonary Disease

## 2023-08-17 VITALS — BP 130/84 | HR 89 | Temp 97.6°F | Wt 278.6 lb

## 2023-08-17 DIAGNOSIS — J4520 Mild intermittent asthma, uncomplicated: Secondary | ICD-10-CM | POA: Diagnosis not present

## 2023-08-17 DIAGNOSIS — F17210 Nicotine dependence, cigarettes, uncomplicated: Secondary | ICD-10-CM | POA: Diagnosis not present

## 2023-08-17 MED ORDER — FLUTICASONE-SALMETEROL 115-21 MCG/ACT IN AERO
2.0000 | INHALATION_SPRAY | Freq: Two times a day (BID) | RESPIRATORY_TRACT | 12 refills | Status: DC
Start: 2023-08-17 — End: 2024-05-22

## 2023-08-17 NOTE — Progress Notes (Unsigned)
Synopsis: Referred in June 2024 for hospital follow up  Subjective:   PATIENT ID: Leroy Lindsey GENDER: male DOB: 1985/09/01, MRN: [id number]  HPI  Chief Complaint  Patient presents with   Follow-up   Leroy Lindsey is a 38 year male, recent former smoker with obesity and asthma who returns pulmonary clinic for asthma.  Initial OV 01/04/23 He was admitted 5/1 to 5/4 for sepsis due to pneumonia with acute hypoxemic respiratory failure.  He has been feeling well since hospitalization.   He was smoking 2 packs per day since he was 38 years old. He is not vaping. He is not smoking other drugs. He stopped drinking alcohol this year.   He has exertional dyspnea and cough. He has intermittent wheezing. He uses albuterol inhaler as needed with good relief. He will wake up occasionally due to cough, wheezing or dyspnea 3 times per week. It was every night before. He has snoring. He does have fatigue/daytime sleepiness.   No seasonal allergies. No GERD symptoms.   He had second hand smoke from his step dad. 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    OV 04/18/23 He reports feeling better since last visit and has been doing well on advair 115-70mcg 2 puffs twice daily. He has resumed smoking and is smoking 1 pack per week. He has intermittent wheezing. He has issues with snoring at night and intermittently wakes up gasping or with dyspnea.   PFTs today show mild restriction and mild diffusion defect. There is curvature of the expiratory flow/volume limb concerning for obstruction.   Today OV 08/17/23 He has been managing well on Advair inhaler, with no recent need for albuterol. They have not experienced any exacerbation of symptoms with recent cold weather. However, they continue to struggle with smoking cessation, currently consuming approximately a pack per week, down from two packs per day.  A recent  CT scan in October showed thickening around the airways in the lower lungs and evidence of air trapping, suggestive of small airways disease. The patient has seen some benefit from the Advair inhaler in managing these symptoms.  The patient has not reported any swelling in the legs or other issues. They have been managing well overall, with no significant changes in their condition or symptoms.  Past Medical History:  Diagnosis Date   Asthma    seasonal   Depression      Family History  Problem Relation Age of Onset   Hypertension Mother      Social History   Socioeconomic History   Marital status: Married    Spouse name: Not on file   Number of children: Not on file   Years of education: Not on file   Highest education level: Not on file  Occupational History   Not on file  Tobacco Use   Smoking status: Some Days    Current packs/day: 0.00    Average packs/day: 2.0 packs/day for 22.7 years (45.5 ttl pk-yrs)    Types: Cigarettes    Start date: 02/24/2000    Last attempt to quit: 11/24/2022    Years since quitting: 0.7   Smokeless tobacco: Never  Vaping Use   Vaping status: Never Used  Substance and Sexual Activity   Alcohol use: No   Drug use: No   Sexual activity: Not on file  Other Topics Concern   Not on file  Social History Narrative  Not on file   Social Drivers of Health   Financial Resource Strain: Not on file  Food Insecurity: No Food Insecurity (11/27/2022)   Hunger Vital Sign    Worried About Running Out of Food in the Last Year: Never true    Ran Out of Food in the Last Year: Never true  Transportation Needs: No Transportation Needs (11/27/2022)   PRAPARE - Administrator, Civil Service (Medical): No    Lack of Transportation (Non-Medical): No  Physical Activity: Not on file  Stress: Not on file  Social Connections: Unknown (12/27/2022)   Received from [facility]   Social Network    Social Network: Not on file  Intimate  Partner Violence: Unknown (12/27/2022)   [facility]   HITS    Physically Hurt: Not on file    Insult or Talk Down To: Not on file    Threaten Physical Harm: Not on file    Scream or Curse: Not on file     No Known Allergies   Outpatient Medications Prior to Visit  Medication Sig Dispense Refill   acetaminophen (TYLENOL) 325 MG tablet Take 650 mg by mouth every 6 (six) hours as needed.     ALLEGRA ALLERGY 180 MG tablet Take 180 mg by mouth daily as needed.     VENTOLIN HFA 108 (90 Base) MCG/ACT inhaler Inhale 2 puffs into the lungs every 4 (four) hours as needed for wheezing or shortness of breath.     fluticasone-salmeterol (ADVAIR HFA) 115-21 MCG/ACT inhaler Inhale 2 puffs into the lungs 2 (two) times daily. 1 each 12   No facility-administered medications prior to visit.   Review of Systems  Constitutional:  Negative for chills, fever, malaise/fatigue and weight loss.  HENT:  Negative for congestion, sinus pain and sore throat.   Eyes: Negative.   Respiratory:  Positive for wheezing. Negative for cough, hemoptysis, sputum production and shortness of breath.   Cardiovascular:  Negative for chest pain, palpitations, orthopnea, claudication and leg swelling.  Gastrointestinal:  Negative for abdominal pain, heartburn, nausea and vomiting.  Genitourinary: Negative.   Musculoskeletal:  Negative for joint pain and myalgias.  Skin:  Negative for rash.  Neurological:  Negative for weakness.  Endo/Heme/Allergies: Negative.   Psychiatric/Behavioral: Negative.     Objective:   Vitals:   08/17/23 1331  BP: 130/84  Pulse: 89  Temp: 97.6 F (36.4 C)  TempSrc: Oral  SpO2: 99%  Weight: 278 lb 9.6 oz (126.4 kg)   Physical Exam Constitutional:      General: He is not in acute distress.    Appearance: He is obese.  HENT:     Head: Normocephalic and atraumatic.  Eyes:     Conjunctiva/sclera: Conjunctivae normal.  Cardiovascular:     Rate and Rhythm:  Normal rate and regular rhythm.     Pulses: Normal pulses.     Heart sounds: Normal heart sounds. No murmur heard. Pulmonary:     Effort: Pulmonary effort is normal.     Breath sounds: Normal breath sounds.  Musculoskeletal:     Right lower leg: No edema.     Left lower leg: No edema.  Skin:    General: Skin is warm and dry.  Neurological:     Mental Status: He is alert.    CBC    Component Value Date/Time   WBC 11.3 (H) 11/27/2022 0515   RBC 4.10 (L) 11/27/2022 0515   HGB 12.0 (L)  11/27/2022 0515   HCT 36.9 (L) 11/27/2022 0515   PLT 317 11/27/2022 0515   MCV 90.0 11/27/2022 0515   MCH 29.3 11/27/2022 0515   MCHC 32.5 11/27/2022 0515   RDW 13.9 11/27/2022 0515   LYMPHSABS 5.7 (H) 11/26/2022 0419   MONOABS 1.3 (H) 11/26/2022 0419   EOSABS 0.1 11/26/2022 0419   BASOSABS 0.0 11/26/2022 0419      Latest Ref Rng & Units 11/27/2022    5:15 AM 11/26/2022    4:19 AM 11/25/2022    4:23 AM  BMP  Glucose 70 - 99 mg/dL 89  88  161   BUN 6 - 20 mg/dL 11  9  12    Creatinine 0.61 - 1.24 mg/dL 0.96  0.45  4.09   Sodium 135 - 145 mmol/L 132  132  133   Potassium 3.5 - 5.1 mmol/L 3.9  3.4  4.1   Chloride 98 - 111 mmol/L 99  98  98   CO2 22 - 32 mmol/L 21  25  25    Calcium 8.9 - 10.3 mg/dL 9.0  8.5  8.5    Chest imaging: HRCT Chest 04/29/23 1. Mild peribronchovascular ground-glass in the lower lobes, nonspecific and possibly postinfectious/postinflammatory in etiology. No evidence of fibrotic interstitial lung disease. 2. Air trapping is indicative of small airways disease.  CTA Chest 11/24/22 1. No evidence of pulmonary embolism. 2. Diffuse bilateral predominantly ground-glass airspace opacities. Findings are favored to represent multifocal pneumonia although pulmonary edema is also a consideration. 3. Mildly enlarged mediastinal and right hilar lymph nodes, likely reactive. 4. Prominent right-sided gynecomastia.  PFT:    Latest Ref Rng & Units 04/18/2023   11:43 AM  PFT  Results  FVC-Pre L 3.01   FVC-Predicted Pre % 55   FVC-Post L 2.67   FVC-Predicted Post % 49   Pre FEV1/FVC % % 83   Post FEV1/FCV % % 45   FEV1-Pre L 2.50   FEV1-Predicted Pre % 57   FEV1-Post L 1.19   DLCO uncorrected ml/min/mmHg 17.98   DLCO UNC% % 56   DLCO corrected ml/min/mmHg 17.98   DLCO COR %Predicted % 56   DLVA Predicted % 101   TLC L 4.92   TLC % Predicted % 70   RV % Predicted % 99     Labs:  Path:  Echo:  Heart Catheterization:    Sleep Study 2006 IMPRESSION/RECOMMENDATION: 1. No sleep-disordered breathing, with only mild snoring and normal oxygenation. This is within normal limits. 2. History suggests sleep schedule disturbance likely to reflect depression, poor sleep hygiene, and possibly a desire to use sleep to avoid scheduled daytime activities. Accuracy and appropriateness of this evaluation can best be determined by the primary physician.   Assessment & Plan:   Mild intermittent asthma without complication - Plan: fluticasone-salmeterol (ADVAIR HFA) 115-21 MCG/ACT inhaler  Cigarette smoker  Discussion: Leroy Lindsey is a 38 year male, recent former smoker with obesity and asthma who returns to pulmonary clinic for follow up.   He is to continue advair HFA 115-19mcg 2 puffs twice daily and continue as needed albuterol 1-2 puffs.   Continue fexofenadine 180mg  daily for allergies.  Follow up in 1 year, call sooner if needed.  Melody Comas, MD [facility] Office: [phone]   Current Outpatient Medications:    acetaminophen (TYLENOL) 325 MG tablet, Take 650 mg by mouth every 6 (six) hours as needed., Disp: , Rfl:    ALLEGRA ALLERGY 180 MG tablet, Take  180 mg by mouth daily as needed., Disp: , Rfl:    VENTOLIN HFA 108 (90 Base) MCG/ACT inhaler, Inhale 2 puffs into the lungs every 4 (four) hours as needed for wheezing or shortness of breath., Disp: , Rfl:    fluticasone-salmeterol (ADVAIR HFA) 115-21 MCG/ACT  inhaler, Inhale 2 puffs into the lungs 2 (two) times daily., Disp: 1 each, Rfl: 12

## 2023-08-17 NOTE — Patient Instructions (Signed)
Continue advair inhaler 2 puffs twice daily -rinse mouth out after each use  Use albuterol inhaler 1-2 puffs every 4-6 hours as needed  Continue fexofenadine 180mg  daily  Follow up in 1 year, call sooner if needed

## 2023-08-18 ENCOUNTER — Encounter: Payer: Self-pay | Admitting: Pulmonary Disease

## 2023-10-05 ENCOUNTER — Encounter: Payer: Self-pay | Admitting: Internal Medicine

## 2023-10-05 ENCOUNTER — Ambulatory Visit: Payer: MEDICAID | Admitting: Internal Medicine

## 2023-10-05 VITALS — BP 132/94 | HR 108 | Temp 98.2°F | Resp 20 | Ht 70.0 in | Wt 276.1 lb

## 2023-10-05 DIAGNOSIS — J4520 Mild intermittent asthma, uncomplicated: Secondary | ICD-10-CM

## 2023-10-05 DIAGNOSIS — Z6839 Body mass index (BMI) 39.0-39.9, adult: Secondary | ICD-10-CM | POA: Insufficient documentation

## 2023-10-05 DIAGNOSIS — H7291 Unspecified perforation of tympanic membrane, right ear: Secondary | ICD-10-CM | POA: Diagnosis not present

## 2023-10-05 DIAGNOSIS — F411 Generalized anxiety disorder: Secondary | ICD-10-CM | POA: Insufficient documentation

## 2023-10-05 DIAGNOSIS — Z0000 Encounter for general adult medical examination without abnormal findings: Secondary | ICD-10-CM | POA: Diagnosis not present

## 2023-10-05 DIAGNOSIS — Z87891 Personal history of nicotine dependence: Secondary | ICD-10-CM

## 2023-10-05 DIAGNOSIS — F17210 Nicotine dependence, cigarettes, uncomplicated: Secondary | ICD-10-CM | POA: Insufficient documentation

## 2023-10-05 DIAGNOSIS — G4733 Obstructive sleep apnea (adult) (pediatric): Secondary | ICD-10-CM | POA: Insufficient documentation

## 2023-10-05 DIAGNOSIS — F32A Depression, unspecified: Secondary | ICD-10-CM

## 2023-10-05 MED ORDER — ESCITALOPRAM OXALATE 10 MG PO TABS: 10.0000 mg | ORAL_TABLET | Freq: Every day | ORAL | 2 refills | Status: DC

## 2023-10-05 NOTE — Progress Notes (Signed)
 Office Visit  Subjective   Patient ID: Leroy Lindsey   DOB: [birth year]   Age: 38 y.o.   MRN: [id number]   Chief Complaint Chief Complaint  Patient presents with   New Patient (Initial Visit)     History of Present Illness Leroy Lindsey is a 38 yo male who comes in today to establish care and for a yearly exam.  His PMHx includes Asthma and allergies as well as tobacco abuse.  His last eye examination was done 3 years ago.  He states his vision is not that great where he has some blurred vision.  The patient has never had a colonscopy or EGD.  There is no family history of colorectal cancer.  He does not exercise.  He does smoke.  He does not get yearly flu vaccines.  He has had 2 COVID-19 vaccines.  There is a family history of maternal grandfather who has had CAD.  There is no strokes.  The patient denies any depression or anxiety.  He has a history of insomnia where he has problems getting and staying asleep.  He has been on a sleep aid in the past but it did not help.    The patient has had a history of depression and anxiety in 10/15/1997 where his father died in 39.  He has seen a therapist in the past who also states that he may have had some PTSD in the past.  He used to be on lexapro he believes in the past.  He was seeing counselling with Family services in [place] with last visit maybe around 2022/2023.  His mother states he lives isolated at home and does go out and it has been like that since COVID-19 pandemic.  Today, he states he does not feel depressed and is not having anxiousness (except for some situational anxiety with being at a new doctor's office.   He also reports difficulty concentrating, difficulty performing routine daily activities, fatigue, extreme feelings of guilt, feelings of isolation, feelings of worthlessness, helpless feeling, insomnia, loss of appetite, social withdrawal, loss of interest in pleasurable activities (playing video games).   He also has panic attacks  maybe once per week.  He denies any suicidal ideation, homicidal ideation, weight loss, or loss of appetite. This patient feels that he is able to care for himself. He has no predisposing factors for depression or anxiety. He currently lives with his mother.  He has never been admitted to a psychiatric facility.     The patient is a 38 year old male who also has a history of mild intermittent asthma which he states that he began having SOB after being hospitalized in 10/16/22 for pneumonia.  He has seen pulmonary medicine with his last visit in 07/2023 where he remains on advair and albuterol.  He does currently smoke where he started smoking at age 60.  He smokes 1.5 pack per week.  He had PFTs done in 03/2023 that showed mild restriction and mild diffusion defect. There is curvature of the expiratory flow/volume limb concerning for obstruction.  A recent CT scan done in 04/2023 showed thickening around the airways in the lower lungs and evidence of air trapping, suggestive of small airways disease. The patient has seen some benefit from the Advair inhaler in managing these symptoms.   He also told pulmonary about snoring and he underwent a at home sleep study.  This sleep study done in 05/2023 showed mild obstructive sleep apnea where they recommended weight loss before  considering CPAP therapy.  He can go up 5 stairs before he gets DOE. The patient's condition is controlled by medications as summarized in the medication list on the face sheet. He has the following modifiable risk factors: smoking and sedentary lifestyle. Specifically denied complaints: worsening exertional dyspnea, productive cough, purulent sputum, hemoptysis, and fever.   There is no seasonal allergies. No GERD symptoms.    He was also noted to have sinus tachycardia when he was hospitalized for pneumonia and sepsis in 2024.  He did see cardiology as an outpatient in 12/2022 and he felt this was a normal physiologic response to his illness at that  time.  They did an ECHO on 11/25/2022 that showed a normal LVEF 65-70% with normal LV function.  The left ventricle had no regional wall motion abnormalities. There was moderate left ventricular hypertrophy. Left ventricular diastolic parameters were indeterminate.  His right ventricular systolic function was normal. His valves looked normal.  The patient also comes in today to discuss his hearing in his left ear.  He states he noticed hearing loss in his left ear about 2 months ago.  He also noted at that time he was having tinnitus.  He did see a hearing audiologist in 08/2023 and they are recommending him to have a hearing aide.  He has brought paperwork in to have medical clearance.  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  He underwent tympanostomy tube placement at that time in both ears.       Past Medical History Past Medical History:  Diagnosis Date   Asthma    seasonal   Depression    OSA (obstructive sleep apnea)      Allergies No Known Allergies   Medications  Current Outpatient Medications:    escitalopram (LEXAPRO) 10 MG tablet, Take 1 tablet (10 mg total) by mouth daily., Disp: 30 tablet, Rfl: 2   fluticasone-salmeterol (ADVAIR HFA) 115-21 MCG/ACT inhaler, Inhale 2 puffs into the lungs 2 (two) times daily., Disp: 1 each, Rfl: 12   VENTOLIN HFA 108 (90 Base) MCG/ACT inhaler, Inhale 2 puffs into the lungs every 4 (four) hours as needed for wheezing or shortness of breath., Disp: , Rfl:    Review of Systems Review of Systems  Constitutional:  Positive for malaise/fatigue. Negative for chills and fever.  Eyes:  Positive for blurred vision. Negative for double vision.  Respiratory:  Negative for cough, hemoptysis, shortness of breath and wheezing.   Cardiovascular:  Negative for chest pain, palpitations and leg swelling.  Gastrointestinal:  Negative for abdominal pain, blood in stool, constipation, diarrhea, heartburn, melena, nausea and vomiting.   Genitourinary:  Negative for frequency and hematuria.  Musculoskeletal:  Negative for back pain and myalgias.  Skin:  Negative for itching and rash.  Neurological:  Negative for dizziness, weakness and headaches.  Endo/Heme/Allergies:  Negative for polydipsia.  Psychiatric/Behavioral:  Negative for substance abuse.        Objective:    Vitals BP (!) 132/94 (BP Location: Right Arm, Patient Position: Sitting, Cuff Size: Large)   Pulse (!) 108   Temp 98.2 F (36.8 C) (Oral)   Resp 20   Ht 5\' 10"  (1.778 m)   Wt 276 lb 2 oz (125.2 kg)   SpO2 96%   BMI 39.62 kg/m    Physical Examination Physical Exam Constitutional:      Appearance: Normal appearance. He is not ill-appearing.  HENT:     Head: Normocephalic and atraumatic.  Ears:     Comments: He has ear perforation of his right TM and I could not see his left TM secondary to wax. Cardiovascular:     Rate and Rhythm: Normal rate and regular rhythm.     Pulses: Normal pulses.     Heart sounds: No murmur heard.    No friction rub. No gallop.  Pulmonary:     Effort: Pulmonary effort is normal. No respiratory distress.     Breath sounds: No wheezing, rhonchi or rales.  Abdominal:     General: Abdomen is flat. Bowel sounds are normal. There is no distension.     Palpations: Abdomen is soft.     Tenderness: There is no abdominal tenderness.  Musculoskeletal:     Right lower leg: No edema.     Left lower leg: No edema.  Skin:    General: Skin is warm and dry.     Findings: No rash.  Neurological:     Mental Status: He is alert.        Assessment & Plan:   Asthma I reviewed his chart from pulmonary.  He has mild intermittent asthma that seems to be controlled on advair.  We will continue on albuterol as needed.  OSA (obstructive sleep apnea) He has mild OSA where I want him to eat and exercise more to lose weight.  Perforation of right tympanic membrane We will refer him to ENT for this TM perforation and hearing  loss.  Annual physical exam Health maintenance was discussed.  We will obtain some yearly labs.  BMI 39.0-39.9,adult He has morbid obesity associated with OSA.  I want him to eat healthy, exercise and lose weight.  Cigarette smoker I have asked him to cut back on his smoking.  Depression I think he has depression unspecificed.  I am going to place him on lexapro 10mg  daily and see him back in 2 months.  This should help with his GAD as well.  GAD (generalized anxiety disorder) Plan as above.  Morbid obesity ([name]) Plan as above.    Return in about 2 months (around 12/05/2023).   Crist Fat, MD

## 2023-10-05 NOTE — Assessment & Plan Note (Signed)
 He has mild OSA where I want him to eat and exercise more to lose weight.

## 2023-10-05 NOTE — Assessment & Plan Note (Signed)
 I think he has depression unspecificed.  I am going to place him on lexapro 10mg  daily and see him back in 2 months.  This should help with his GAD as well.

## 2023-10-05 NOTE — Assessment & Plan Note (Signed)
 Plan as above.

## 2023-10-05 NOTE — Assessment & Plan Note (Signed)
I have asked him to cut back on his smoking.

## 2023-10-05 NOTE — Assessment & Plan Note (Signed)
 I reviewed his chart from pulmonary.  He has mild intermittent asthma that seems to be controlled on advair.  We will continue on albuterol as needed.

## 2023-10-05 NOTE — Assessment & Plan Note (Signed)
Health maintenance was discussed.  We will obtain some yearly labs. 

## 2023-10-05 NOTE — Assessment & Plan Note (Signed)
 He has morbid obesity associated with OSA.  I want him to eat healthy, exercise and lose weight.

## 2023-10-05 NOTE — Assessment & Plan Note (Signed)
 We will refer him to ENT for this TM perforation and hearing loss.

## 2023-10-06 LAB — CMP14 + ANION GAP
ALT: 42 IU/L (ref 0–44)
AST: 29 IU/L (ref 0–40)
Albumin: 4.6 g/dL (ref 4.1–5.1)
Alkaline Phosphatase: 85 IU/L (ref 44–121)
Anion Gap: 14 mmol/L (ref 10.0–18.0)
BUN/Creatinine Ratio: 10 (ref 9–20)
BUN: 9 mg/dL (ref 6–20)
Bilirubin Total: 0.4 mg/dL (ref 0.0–1.2)
CO2: 23 mmol/L (ref 20–29)
Calcium: 9.5 mg/dL (ref 8.7–10.2)
Chloride: 103 mmol/L (ref 96–106)
Creatinine, Ser: 0.89 mg/dL (ref 0.76–1.27)
Globulin, Total: 3.2 g/dL (ref 1.5–4.5)
Glucose: 98 mg/dL (ref 70–99)
Potassium: 4.2 mmol/L (ref 3.5–5.2)
Sodium: 140 mmol/L (ref 134–144)
Total Protein: 7.8 g/dL (ref 6.0–8.5)
eGFR: 112 mL/min/{1.73_m2} (ref >59)

## 2023-10-06 LAB — CBC WITH DIFFERENTIAL/PLATELET
Basophils Absolute: 0 10*3/uL (ref 0.0–0.2)
Basos: 1 %
EOS (ABSOLUTE): 0.1 10*3/uL (ref 0.0–0.4)
Eos: 1 %
Hematocrit: 42.6 % (ref 37.5–51.0)
Hemoglobin: 14.3 g/dL (ref 13.0–17.7)
Immature Grans (Abs): 0 10*3/uL (ref 0.0–0.1)
Immature Granulocytes: 0 %
Lymphocytes Absolute: 3.5 10*3/uL — ABNORMAL HIGH (ref 0.7–3.1)
Lymphs: 46 %
MCH: 29.2 pg (ref 26.6–33.0)
MCHC: 33.6 g/dL (ref 31.5–35.7)
MCV: 87 fL (ref 79–97)
Monocytes Absolute: 0.6 10*3/uL (ref 0.1–0.9)
Monocytes: 8 %
Neutrophils Absolute: 3.4 10*3/uL (ref 1.4–7.0)
Neutrophils: 44 %
Platelets: 240 10*3/uL (ref 150–450)
RBC: 4.9 x10E6/uL (ref 4.14–5.80)
RDW: 13 % (ref 11.6–15.4)
WBC: 7.7 10*3/uL (ref 3.4–10.8)

## 2023-10-06 LAB — HEMOGLOBIN A1C
Est. average glucose Bld gHb Est-mCnc: 126 mg/dL
Hgb A1c MFr Bld: 6 % — ABNORMAL HIGH (ref 4.8–5.6)

## 2023-10-06 LAB — LIPID PANEL
Chol/HDL Ratio: 4.6 ratio (ref 0.0–5.0)
Cholesterol, Total: 170 mg/dL (ref 100–199)
HDL: 37 mg/dL — ABNORMAL LOW (ref >39)
LDL Chol Calc (NIH): 108 mg/dL — ABNORMAL HIGH (ref 0–99)
Triglycerides: 139 mg/dL (ref 0–149)
VLDL Cholesterol Cal: 25 mg/dL (ref 5–40)

## 2023-10-06 LAB — TSH: TSH: 0.553 u[IU]/mL (ref 0.450–4.500)

## 2023-10-18 NOTE — Progress Notes (Signed)
 His labs look good.  Patient aware of labs

## 2023-12-02 ENCOUNTER — Ambulatory Visit: Payer: MEDICAID | Admitting: Internal Medicine

## 2023-12-02 ENCOUNTER — Encounter: Payer: Self-pay | Admitting: Internal Medicine

## 2023-12-02 VITALS — BP 126/82 | HR 96 | Temp 97.2°F | Resp 20 | Ht 70.0 in | Wt 278.8 lb

## 2023-12-02 DIAGNOSIS — F32A Depression, unspecified: Secondary | ICD-10-CM | POA: Diagnosis not present

## 2023-12-02 MED ORDER — ESCITALOPRAM OXALATE 20 MG PO TABS
20.0000 mg | ORAL_TABLET | Freq: Every day | ORAL | 1 refills | Status: DC
Start: 2023-12-02 — End: 2024-02-14

## 2023-12-02 NOTE — Progress Notes (Signed)
 Office Visit  Subjective   Patient ID: Leroy Lindsey   DOB: [birth year]   Age: 38 y.o.   MRN: [id number]   Chief Complaint Chief Complaint  Patient presents with   Follow-up    2 Month F/U     History of Present Illness The patient has had a history of depression and anxiety in 12-12-1997 where his father died in 77.  He has seen a therapist in the past who also states that he may have had some PTSD in the past.  He used to be on lexapro  in the past and on his last visit 2 months ago I started him on lexapro  10mg  daily.  He states that he took it for a month and it did not really help with his depression.  He was seeing counselling with Family services in [place] with last visit maybe around 2022/2023.  His mother states he lives isolated at home and does go out and it has been like that since COVID-19 pandemic.  Today, he states he does not feel depressed and is not having anxiousness (except for some situational anxiety with being at a new doctor's office.   He also reports difficulty concentrating, difficulty performing routine daily activities, fatigue, extreme feelings of guilt, feelings of isolation, feelings of worthlessness, helpless feeling, insomnia, loss of appetite, social withdrawal, loss of interest in pleasurable activities (playing video games).   He also has panic attacks maybe once per week.  He denies any suicidal ideation, homicidal ideation, weight loss, or loss of appetite. This patient feels that he is able to care for himself. He has no predisposing factors for depression or anxiety. He currently lives with his mother.  He has never been admitted to a psychiatric facility.        Past Medical History Past Medical History:  Diagnosis Date   Asthma    seasonal   Depression    OSA (obstructive sleep apnea)      Allergies No Known Allergies   Medications  Current Outpatient Medications:    fluticasone -salmeterol (ADVAIR HFA) 115-21 MCG/ACT inhaler, Inhale 2 puffs  into the lungs 2 (two) times daily., Disp: 1 each, Rfl: 12   VENTOLIN  HFA 108 (90 Base) MCG/ACT inhaler, Inhale 2 puffs into the lungs every 4 (four) hours as needed for wheezing or shortness of breath., Disp: , Rfl:    Review of Systems Review of Systems  Constitutional:  Negative for chills and fever.  Respiratory:  Negative for shortness of breath.   Cardiovascular:  Negative for chest pain, palpitations and leg swelling.  Gastrointestinal:  Negative for abdominal pain, blood in stool, constipation, diarrhea, nausea and vomiting.  Psychiatric/Behavioral:  Positive for depression. Negative for substance abuse and suicidal ideas. The patient is not nervous/anxious and does not have insomnia.        Objective:    Vitals BP 126/82   Pulse 96   Temp (!) 97.2 F (36.2 C) (Temporal)   Resp 20   Ht 5\' 10"  (1.778 m)   Wt 278 lb 12.8 oz (126.5 kg)   SpO2 96%   BMI 40.00 kg/m    Physical Examination Physical Exam Constitutional:      Appearance: Normal appearance. He is not ill-appearing.  Cardiovascular:     Rate and Rhythm: Normal rate and regular rhythm.     Pulses: Normal pulses.     Heart sounds: No murmur heard.    No friction rub. No gallop.  Pulmonary:     Effort:  Pulmonary effort is normal. No respiratory distress.     Breath sounds: No wheezing, rhonchi or rales.  Abdominal:     General: Abdomen is flat. Bowel sounds are normal. There is no distension.     Palpations: Abdomen is soft.     Tenderness: There is no abdominal tenderness.  Musculoskeletal:     Right lower leg: No edema.     Left lower leg: No edema.  Skin:    General: Skin is warm and dry.     Findings: No rash.  Neurological:     Mental Status: He is alert.        Assessment & Plan:   Depression I am going to increase his lexapro  from 10mg  to 20mg  daily.  We will see him back in 2 months.    No follow-ups on file.   Wayne Haines, MD

## 2023-12-02 NOTE — Assessment & Plan Note (Signed)
 I am going to increase his lexapro  from 10mg  to 20mg  daily.  We will see him back in 2 months.

## 2023-12-15 ENCOUNTER — Ambulatory Visit (INDEPENDENT_AMBULATORY_CARE_PROVIDER_SITE_OTHER): Payer: MEDICAID | Admitting: Otolaryngology

## 2023-12-15 ENCOUNTER — Encounter (INDEPENDENT_AMBULATORY_CARE_PROVIDER_SITE_OTHER): Payer: Self-pay | Admitting: Otolaryngology

## 2023-12-15 VITALS — BP 166/105 | HR 85 | Ht 70.0 in | Wt 270.0 lb

## 2023-12-15 DIAGNOSIS — H6691 Otitis media, unspecified, right ear: Secondary | ICD-10-CM | POA: Diagnosis not present

## 2023-12-15 DIAGNOSIS — H7203 Central perforation of tympanic membrane, bilateral: Secondary | ICD-10-CM

## 2023-12-15 DIAGNOSIS — H7293 Unspecified perforation of tympanic membrane, bilateral: Secondary | ICD-10-CM

## 2023-12-15 DIAGNOSIS — H6123 Impacted cerumen, bilateral: Secondary | ICD-10-CM

## 2023-12-15 DIAGNOSIS — H900 Conductive hearing loss, bilateral: Secondary | ICD-10-CM

## 2023-12-15 MED ORDER — CIPROFLOXACIN-DEXAMETHASONE 0.3-0.1 % OT SUSP: 4.0000 [drp] | Freq: Two times a day (BID) | OTIC | 8 refills | Status: AC

## 2023-12-15 NOTE — Progress Notes (Signed)
 CC: Recurrent ear infections, hearing loss, tympanic membrane perforations  HPI:  Leroy Lindsey is a 38 y.o. male who presents today complaining of recurrent ear infections, hearing loss, and tympanic membrane perforations.  According to the patient, he has a history of recurrent childhood otitis media.  Underwent bilateral myringotomy and tube placement twice by Dr. Sean Czar.  The tubes have since extruded, resulting in tympanic membrane perforations.  According to the patient, he has no useful hearing on the right side.  He also complains of frequent right ear drainage.  Currently he is not on any otologic medication.  Past Medical History:  Diagnosis Date   Asthma    seasonal   Depression    OSA (obstructive sleep apnea)     Past Surgical History:  Procedure Laterality Date   TYMPANOSTOMY TUBE PLACEMENT     WISDOM TOOTH EXTRACTION      Family History  Problem Relation Age of Onset   Hypertension Mother     Social History:  reports that he has been smoking cigarettes. He started smoking about 23 years ago. He has a 45.5 pack-year smoking history. He has never used smokeless tobacco. He reports that he does not drink alcohol and does not use drugs.  Allergies: No Known Allergies  Prior to Admission medications   Medication Sig Start Date End Date Taking? Authorizing Provider  ciprofloxacin-dexamethasone (CIPRODEX) OTIC suspension Place 4 drops into the right ear 2 (two) times daily for 14 days. 12/15/23 12/29/23 Yes Reynold Caves, MD  escitalopram  (LEXAPRO ) 20 MG tablet Take 1 tablet (20 mg total) by mouth daily. 12/02/23  Yes Wayne Haines, MD  fluticasone -salmeterol (ADVAIR HFA) 115-21 MCG/ACT inhaler Inhale 2 puffs into the lungs 2 (two) times daily. 08/17/23  Yes Wilfredo Hanly, MD  VENTOLIN  HFA 108 (90 Base) MCG/ACT inhaler Inhale 2 puffs into the lungs every 4 (four) hours as needed for wheezing or shortness of breath.   Yes [provider]    Blood pressure  (!) 166/105, pulse 85, height 5\' 10"  (1.778 m), weight 270 lb (122.5 kg), SpO2 96%. Exam: General: Communicates without difficulty, well nourished, no acute distress. Head: Normocephalic, no evidence injury, no tenderness, facial buttresses intact without stepoff. Face/sinus: No tenderness to palpation and percussion. Facial movement is normal and symmetric. Eyes: PERRL, EOMI. No scleral icterus, conjunctivae clear. Neuro: CN II exam reveals vision grossly intact.  No nystagmus at any point of gaze. Ears: Auricles well formed without lesions.  Bilateral cerumen impaction.  Nose: External evaluation reveals normal support and skin without lesions.  Dorsum is intact.  Anterior rhinoscopy reveals congested mucosa over anterior aspect of inferior turbinates and intact septum.  No purulence noted. Oral:  Oral cavity and oropharynx are intact, symmetric, without erythema or edema.  Mucosa is moist without lesions. Neck: Full range of motion without pain.  There is no significant lymphadenopathy.  No masses palpable.  Thyroid bed within normal limits to palpation.  Parotid glands and submandibular glands equal bilaterally without mass.  Trachea is midline. Neuro:  CN 2-12 grossly intact.   Procedure: Bilateral cerumen disimpaction Anesthesia: None Description: Under the operating microscope, the cerumen is carefully removed with a combination of cerumen currette, alligator forceps, and suction catheters.  After the cerumen is removed, bilateral tympanic membrane perforations are noted, larger on the right side.  The right middle ear is covered with polypoid tissue, with mucoid drainage noted.  No drainage is noted on the left side.  No mass, erythema, or  lesions. The patient tolerated the procedure well.    Assessment: 1.  Bilateral cerumen impaction. 2.  After the cerumen disimpaction procedure, bilateral tympanic membrane perforations are noted.  The perforation is larger on the right side. 3.  Chronic right  otitis media, with polypoid tissue and mucoid drainage filling the right middle ear space.  No drainage is noted on the left side.  Plan: 1.  Otomicroscopy with bilateral cerumen disimpaction. 2.  The physical exam findings are reviewed with the patient and his mother. 3.  Ciprodex  eardrops 4 drops right ear twice daily for 2 weeks. 4.  Bilateral dry ear precautions. 5.  The patient will return for reevaluation in 3 to 4 weeks.  We will perform a hearing test at that time.  Leroy Lindsey W Leroy Lindsey 12/15/2023, 4:08 PM

## 2023-12-16 DIAGNOSIS — H6122 Impacted cerumen, left ear: Secondary | ICD-10-CM | POA: Insufficient documentation

## 2023-12-16 DIAGNOSIS — H7203 Central perforation of tympanic membrane, bilateral: Secondary | ICD-10-CM | POA: Insufficient documentation

## 2023-12-16 DIAGNOSIS — H6123 Impacted cerumen, bilateral: Secondary | ICD-10-CM | POA: Insufficient documentation

## 2023-12-16 DIAGNOSIS — H900 Conductive hearing loss, bilateral: Secondary | ICD-10-CM | POA: Insufficient documentation

## 2024-01-20 ENCOUNTER — Encounter (INDEPENDENT_AMBULATORY_CARE_PROVIDER_SITE_OTHER): Payer: Self-pay | Admitting: Otolaryngology

## 2024-01-20 ENCOUNTER — Ambulatory Visit (INDEPENDENT_AMBULATORY_CARE_PROVIDER_SITE_OTHER): Payer: MEDICAID | Admitting: Otolaryngology

## 2024-01-20 ENCOUNTER — Ambulatory Visit (INDEPENDENT_AMBULATORY_CARE_PROVIDER_SITE_OTHER): Payer: MEDICAID | Admitting: Audiology

## 2024-01-20 VITALS — BP 138/95 | HR 86 | Ht 70.0 in | Wt 278.0 lb

## 2024-01-20 DIAGNOSIS — H906 Mixed conductive and sensorineural hearing loss, bilateral: Secondary | ICD-10-CM | POA: Diagnosis not present

## 2024-01-20 DIAGNOSIS — H6691 Otitis media, unspecified, right ear: Secondary | ICD-10-CM

## 2024-01-20 DIAGNOSIS — H7292 Unspecified perforation of tympanic membrane, left ear: Secondary | ICD-10-CM | POA: Diagnosis not present

## 2024-01-20 DIAGNOSIS — H66014 Acute suppurative otitis media with spontaneous rupture of ear drum, recurrent, right ear: Secondary | ICD-10-CM

## 2024-01-20 NOTE — Progress Notes (Signed)
  [address] [place], [place] [address] [phone]  Audiological Evaluation    Name: Leroy Lindsey     DOB:   Jun 07, 1986      MRN:   [id number]                                                                                     Service Date: 01/20/2024     Accompanied by: unaccompanied to the booth   Patient comes today after Dr. Karis, ENT sent a referral for a hearing evaluation due to concerns with hearing loss.   Symptoms Yes Details  Hearing loss  [x]  Known bilateral hearing loss, reports that cannot hear form the right ear  Tinnitus  []    Ear pain/ infections/pressure  [x]  Reports recurrent ear drainage, significant itchiness in his right ear while testing him  Balance problems  []    Noise exposure history  []    Previous ear surgeries  [x]  Had tubes placed and Dr. Karis  (12-15-2023) mentioned  that he had bilateral perforations in both eardrums.  Family history of hearing loss  []    Amplification  []    Other  []      Otoscopy: Right ear: Abnormal eardrum appearance. Left ear:  Abnormal eardrum appearance.  Tympanometry: Right ear: Did not test due to abnormal otoscopy ( possible drainage ).. Left ear: Did not test due to abnormal otoscopy ( possible drainage )..    Pure tone Audiometry: Both ears- Best responses suggest a severe to profound mixed hearing loss from [phone], in both ears. Interpret with caution due to masking dilemma and/or under masking due to equipment intensity limits.     Speech Audiometry: Right ear- Speech Reception Threshold (SRT) was obtained at 85 dBHL. Left ear-Speech Reception Threshold (SRT) was obtained at 85 dBHL.   Word Recognition Score Tested using NU-6 (recorded) Right ear: 84% was obtained at a presentation level of 110 dBHL with contralateral masking which is deemed as  good . Left ear: 92% was obtained at a presentation level of 110 dBHL with contralateral masking which is deemed as  excellent.  Interpret with  caution due to masking dilemma and/or under masking due to equipment intensity limits.     The hearing test results were completed under headphones and results are deemed to be of fair reliability. Test technique:  conventional      Recommendations: Follow up with ENT as scheduled for today. Consider a communication needs assessment after medical clearance for hearing aids is obtained.   Mikiya Nebergall MARIE LEROUX-MARTINEZ, AUD

## 2024-01-21 DIAGNOSIS — H66011 Acute suppurative otitis media with spontaneous rupture of ear drum, right ear: Secondary | ICD-10-CM | POA: Insufficient documentation

## 2024-01-21 DIAGNOSIS — H906 Mixed conductive and sensorineural hearing loss, bilateral: Secondary | ICD-10-CM | POA: Insufficient documentation

## 2024-01-21 NOTE — Progress Notes (Signed)
 Patient ID: Leroy Lindsey, male   DOB: 1985-12-02, 38 y.o.   MRN: [id number]  Follow-up: Recurrent ear infections, bilateral tympanic membrane perforations, hearing loss  HPI: The patient is a 38 year old male who returns today for his follow-up evaluation.  He was last seen in May 2025.  At that time, he was noted to have bilateral tympanic membrane perforations.  Chronic infection was noted within the right middle ear space.  Polypoid tissue and mucoid drainage was noted to fill the right middle ear space.  He was treated with debridement and Ciprodex  eardrops.  The patient returns today reporting improvement in his right ear drainage.  He continues to have bilateral hearing difficulty, worse on the right side.  He denies any significant otalgia or vertigo.  Exam: General: Communicates without difficulty, well nourished, no acute distress. Head: Normocephalic, no evidence injury, no tenderness, facial buttresses intact without stepoff. Face/sinus: No tenderness to palpation and percussion. Facial movement is normal and symmetric. Eyes: PERRL, EOMI. No scleral icterus, conjunctivae clear. Neuro: CN II exam reveals vision grossly intact.  No nystagmus at any point of gaze. Ears: Auricles well formed without lesions.  Bilateral tympanic membrane perforations, larger on the right side.  A small amount of drainage is noted from the right ear.  Under the operating microscope, the right ear canal is debrided with a suction catheter.  Nose: External evaluation reveals normal support and skin without lesions.  Dorsum is intact.  Anterior rhinoscopy reveals congested mucosa over anterior aspect of inferior turbinates and intact septum.  No purulence noted. Oral:  Oral cavity and oropharynx are intact, symmetric, without erythema or edema.  Mucosa is moist without lesions. Neck: Full range of motion without pain.  There is no significant lymphadenopathy.  No masses palpable.  Thyroid bed within normal limits to  palpation.  Parotid glands and submandibular glands equal bilaterally without mass.  Trachea is midline. Neuro:  CN 2-12 grossly intact.   His hearing test shows bilateral mixed hearing loss, worse on the right side.  Assessment: 1.  Chronic right otitis media with otorrhea.  A small amount of drainage is noted today.  The polypoid tissue within the right middle ear space has significantly decreased. 2.  Dry left tympanic membrane perforation. 3.  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  Plan: 1.  Otomicroscopy with debridement of the right ear canal. 2.  The physical exam findings and the hearing test results are reviewed with the patient. 3.  Continue Ciprodex  eardrops 2 times per week. 4.  Bilateral dry ear precautions. 5.  The patient is a candidate for hearing amplification.  The hearing aid options are discussed. 6.  The patient will return for reevaluation in 6 months, sooner if needed.

## 2024-01-31 ENCOUNTER — Encounter: Payer: Self-pay | Admitting: Audiology

## 2024-02-03 ENCOUNTER — Ambulatory Visit: Payer: MEDICAID | Admitting: Internal Medicine

## 2024-02-14 ENCOUNTER — Encounter: Payer: Self-pay | Admitting: Internal Medicine

## 2024-02-14 ENCOUNTER — Ambulatory Visit: Payer: MEDICAID | Admitting: Internal Medicine

## 2024-02-14 VITALS — BP 124/82 | HR 110 | Temp 98.2°F | Resp 18 | Ht 70.0 in | Wt 280.2 lb

## 2024-02-14 DIAGNOSIS — F32A Depression, unspecified: Secondary | ICD-10-CM | POA: Diagnosis not present

## 2024-02-14 DIAGNOSIS — F411 Generalized anxiety disorder: Secondary | ICD-10-CM

## 2024-02-14 DIAGNOSIS — R7303 Prediabetes: Secondary | ICD-10-CM | POA: Insufficient documentation

## 2024-02-14 DIAGNOSIS — F410 Panic disorder [episodic paroxysmal anxiety] without agoraphobia: Secondary | ICD-10-CM | POA: Insufficient documentation

## 2024-02-14 MED ORDER — HYDROXYZINE PAMOATE 25 MG PO CAPS: 25.0000 mg | ORAL_CAPSULE | Freq: Every day | ORAL | 1 refills | Status: DC | PRN

## 2024-02-14 MED ORDER — ESCITALOPRAM OXALATE 20 MG PO TABS: 20.0000 mg | ORAL_TABLET | Freq: Every day | ORAL | 3 refills | Status: DC

## 2024-02-14 NOTE — Assessment & Plan Note (Signed)
We will check his HgBa1c on his next visit.

## 2024-02-14 NOTE — Assessment & Plan Note (Signed)
 His depression is doing better and he thinks the lexapro  is helping.  He does not want to go up on his dose.  I will start on hydroxyzine  25mg  po daily prn for panic attacks.

## 2024-02-14 NOTE — Progress Notes (Signed)
 Office Visit  Subjective   Patient ID: Leroy Lindsey   DOB: Apr 25, 1986   Age: 38 y.o.   MRN: [id number]   Chief Complaint Chief Complaint  Patient presents with   Follow-up     History of Present Illness The patient returns today for followup of his depression and anxiety.  He has a history of depression and anxiety in 1998-03-15 where his father died in 30.  He has seen a therapist in the past who also states that he may have had some PTSD in the past.  I started him 4 months ago back on lexapro  where we have uptitrated him.  ON his last visit 2 months ago we increased his lexapro  from 10mg  to 20mg  daily.  Today, he states the higher dosage of lexapro  has helped.  He states his depression today is mild but his anxiety is moderate.  He is having about 2 panic attacks per week.  He was seeing counselling with [facility] in [place] with last visit maybe around 2022/2023.  His mother states he lives isolated at home and does go out and it has been like that since COVID-19 pandemic.  Today, he states he does not feel depressed and is not having anxiousness (except for some situational anxiety with being at a new doctor's office.   He also reports difficulty concentrating, difficulty performing routine daily activities, fatigue, extreme feelings of guilt, feelings of isolation, feelings of worthlessness, helpless feeling, insomnia, loss of appetite, social withdrawal, loss of interest in pleasurable activities (playing video games).   He also has panic attacks maybe once per week.  He denies any suicidal ideation, homicidal ideation, weight loss, or loss of appetite. This patient feels that he is able to care for himself. He has no predisposing factors for depression or anxiety. He currently lives with his mother.  He has never been admitted to a psychiatric facility.     Past Medical History Past Medical History:  Diagnosis Date   Asthma    seasonal   Depression    OSA (obstructive sleep  apnea)      Allergies No Known Allergies   Medications  Current Outpatient Medications:    escitalopram  (LEXAPRO ) 20 MG tablet, Take 1 tablet (20 mg total) by mouth daily., Disp: 30 tablet, Rfl: 1   fluticasone -salmeterol (ADVAIR HFA) 115-21 MCG/ACT inhaler, Inhale 2 puffs into the lungs 2 (two) times daily., Disp: 1 each, Rfl: 12   VENTOLIN  HFA 108 (90 Base) MCG/ACT inhaler, Inhale 2 puffs into the lungs every 4 (four) hours as needed for wheezing or shortness of breath., Disp: , Rfl:    Review of Systems Review of Systems  Constitutional:  Negative for chills and fever.  Eyes:  Negative for blurred vision.  Respiratory:  Negative for shortness of breath.   Cardiovascular:  Negative for chest pain and palpitations.  Gastrointestinal:  Positive for constipation. Negative for abdominal pain, diarrhea, nausea and vomiting.  Neurological:  Negative for dizziness, weakness and headaches.  Psychiatric/Behavioral:  Negative for substance abuse.        Objective:    Vitals BP 124/82   Pulse (!) 110   Temp 98.2 F (36.8 C)   Resp 18   Ht 5' 10 (1.778 m)   Wt 280 lb 3.2 oz (127.1 kg)   SpO2 97%   BMI 40.20 kg/m    Physical Examination Physical Exam Constitutional:      Appearance: Normal appearance. He is not ill-appearing.  Cardiovascular:  Rate and Rhythm: Normal rate and regular rhythm.     Pulses: Normal pulses.     Heart sounds: No murmur heard.    No friction rub. No gallop.  Pulmonary:     Effort: Pulmonary effort is normal. No respiratory distress.     Breath sounds: No wheezing, rhonchi or rales.  Abdominal:     General: Abdomen is flat. Bowel sounds are normal. There is no distension.     Palpations: Abdomen is soft.     Tenderness: There is no abdominal tenderness.  Musculoskeletal:     Right lower leg: No edema.     Left lower leg: No edema.  Skin:    General: Skin is warm and dry.     Findings: No rash.  Neurological:     General: No focal deficit  present.     Mental Status: He is alert and oriented to person, place, and time.  Psychiatric:        Mood and Affect: Mood normal.        Behavior: Behavior normal.        Assessment & Plan:   Prediabetes We will check his HgBa1c on his next visit.  Depression His depression is doing better and he thinks the lexapro  is helping.  He does not want to go up on his dose.  I will start on hydroxyzine  25mg  po daily prn for panic attacks.  GAD (generalized anxiety disorder) Plan as above.  Panic disorder Plan as above.    Return in about 3 months (around 05/16/2024).   Selinda Fleeta Finger, MD

## 2024-02-14 NOTE — Assessment & Plan Note (Signed)
 Plan as above.

## 2024-05-14 ENCOUNTER — Ambulatory Visit: Payer: MEDICAID | Admitting: Internal Medicine

## 2024-05-21 ENCOUNTER — Ambulatory Visit: Payer: MEDICAID | Admitting: Internal Medicine

## 2024-05-22 ENCOUNTER — Encounter: Payer: Self-pay | Admitting: Internal Medicine

## 2024-05-22 ENCOUNTER — Ambulatory Visit: Payer: MEDICAID | Admitting: Internal Medicine

## 2024-05-22 VITALS — BP 138/98 | HR 95 | Temp 97.6°F | Resp 16 | Ht 70.0 in | Wt 271.4 lb

## 2024-05-22 DIAGNOSIS — F330 Major depressive disorder, recurrent, mild: Secondary | ICD-10-CM

## 2024-05-22 DIAGNOSIS — F411 Generalized anxiety disorder: Secondary | ICD-10-CM | POA: Diagnosis not present

## 2024-05-22 DIAGNOSIS — J4520 Mild intermittent asthma, uncomplicated: Secondary | ICD-10-CM

## 2024-05-22 DIAGNOSIS — R7303 Prediabetes: Secondary | ICD-10-CM | POA: Diagnosis not present

## 2024-05-22 DIAGNOSIS — Z23 Encounter for immunization: Secondary | ICD-10-CM

## 2024-05-22 MED ORDER — FLUTICASONE-SALMETEROL 115-21 MCG/ACT IN AERO: 2.0000 | INHALATION_SPRAY | Freq: Two times a day (BID) | RESPIRATORY_TRACT | 12 refills | Status: DC

## 2024-05-22 MED ORDER — VENTOLIN HFA 108 (90 BASE) MCG/ACT IN AERS: 2.0000 | INHALATION_SPRAY | RESPIRATORY_TRACT | 2 refills | Status: DC | PRN

## 2024-05-22 MED ORDER — HYDROXYZINE PAMOATE 25 MG PO CAPS: 25.0000 mg | ORAL_CAPSULE | Freq: Every day | ORAL | 1 refills | Status: DC | PRN

## 2024-05-22 MED ORDER — ESCITALOPRAM OXALATE 20 MG PO TABS: 20.0000 mg | ORAL_TABLET | Freq: Every day | ORAL | 3 refills | Status: DC

## 2024-05-22 NOTE — Assessment & Plan Note (Signed)
We will check a HgBa1c on him today.

## 2024-05-22 NOTE — Addendum Note (Signed)
 Addended by: LENETTA LACKS on: 05/22/2024 04:15 PM   Modules accepted: Orders

## 2024-05-22 NOTE — Assessment & Plan Note (Signed)
 Both his depression and anxiety are controlled on his current meds and are mild.  We will continue his currernt medications.

## 2024-05-22 NOTE — Progress Notes (Signed)
 Office Visit  Subjective   Patient ID: Leroy Lindsey   DOB: [birth year]   Age: 38 y.o.   MRN: [id number]   Chief Complaint Chief Complaint  Patient presents with   Follow-up    3 Month follow up     History of Present Illness The patient returns today for followup of his depression and anxiety.  He has a history of depression and anxiety in Jun 04, 1998 where his father died in 71.  He has seen a therapist in the past who also states that he may have had some PTSD in the past.   On his last visit, he was having some panic attacks and we therefore started him on hydroxyzine  25mg  po daily prn for panic attacks.  He states this has helped.  This past year, I started him on lexapro  where we have uptitrated him.  He is currently on lexapro  20mg  daily.  Today, he states both his depression and anxiety are mild.  He still has panic attacks maybe twice a week but again the hydoxyxzine helps.  He was seeing counselling with [facility] in [place] with last visit maybe around 2022/2023.  His mother states he lives isolated at home and does go out and it has been like that since COVID-19 pandemic.  Today, he states he does not feel depressed and is not having anxiousness (except for some situational anxiety with being at a new doctor's office.   He also reports difficulty concentrating, difficulty performing routine daily activities, fatigue, extreme feelings of guilt, feelings of isolation, feelings of worthlessness, helpless feeling, insomnia, loss of appetite, social withdrawal, loss of interest in pleasurable activities (playing video games).   He also has panic attacks maybe once per week.  He denies any suicidal ideation, homicidal ideation, weight loss, or loss of appetite. This patient feels that he is able to care for himself. He has no predisposing factors for depression or anxiety. He currently lives with his mother.  He has never been admitted to a psychiatric facility.  The patient also returns  today for followup of his prediabetes.  He was discovered on yearly lab testing in 09/2023 to have a HgBa1c 6%.  I asked him to watch his diet and exercise at that time.  He is currently not on any medications for his prediabetes.  He does not check his blood sugars.  His last HgBa1c was 6% which was done in 09/2023.     Past Medical History Past Medical History:  Diagnosis Date   Asthma    seasonal   Depression    OSA (obstructive sleep apnea)      Allergies No Known Allergies   Medications  Current Outpatient Medications:    escitalopram  (LEXAPRO ) 20 MG tablet, Take 1 tablet (20 mg total) by mouth daily., Disp: 90 tablet, Rfl: 3   fluticasone -salmeterol (ADVAIR HFA) 115-21 MCG/ACT inhaler, Inhale 2 puffs into the lungs 2 (two) times daily., Disp: 1 each, Rfl: 12   hydrOXYzine  (VISTARIL ) 25 MG capsule, Take 1 capsule (25 mg total) by mouth daily as needed for anxiety (panic attacks)., Disp: 30 capsule, Rfl: 1   VENTOLIN  HFA 108 (90 Base) MCG/ACT inhaler, Inhale 2 puffs into the lungs every 4 (four) hours as needed for wheezing or shortness of breath., Disp: , Rfl:    Review of Systems Review of Systems  Constitutional:  Negative for chills and fever.  Eyes:  Negative for blurred vision.  Respiratory:  Negative for shortness of breath.   Cardiovascular:  Negative for chest pain and palpitations.  Gastrointestinal:  Negative for abdominal pain, constipation, diarrhea, nausea and vomiting.  Genitourinary:  Negative for frequency.  Neurological:  Negative for dizziness, weakness and headaches.  Endo/Heme/Allergies:  Negative for polydipsia.       Objective:    Vitals BP (!) 138/98   Pulse 95   Temp 97.6 F (36.4 C) (Temporal)   Resp 16   Ht 5' 10 (1.778 m)   Wt 271 lb 6.4 oz (123.1 kg)   SpO2 96%   BMI 38.94 kg/m    Physical Examination Physical Exam Constitutional:      Appearance: Normal appearance. He is not ill-appearing.  Cardiovascular:     Rate and Rhythm:  Normal rate and regular rhythm.     Pulses: Normal pulses.     Heart sounds: No murmur heard.    No friction rub. No gallop.  Pulmonary:     Effort: Pulmonary effort is normal. No respiratory distress.     Breath sounds: No wheezing, rhonchi or rales.  Abdominal:     General: Abdomen is flat. Bowel sounds are normal. There is no distension.     Palpations: Abdomen is soft.     Tenderness: There is no abdominal tenderness.  Musculoskeletal:     Right lower leg: No edema.     Left lower leg: No edema.  Skin:    General: Skin is warm and dry.     Findings: No rash.  Neurological:     General: No focal deficit present.     Mental Status: He is alert and oriented to person, place, and time.  Psychiatric:        Mood and Affect: Mood normal.        Behavior: Behavior normal.        Assessment & Plan:   Prediabetes We will check a HgBa1c on him today.  GAD (generalized anxiety disorder) Both his depression and anxiety are controlled on his current meds and are mild.  We will continue his currernt medications.  Depression He has mild recurrent major depression.    Return in about 3 months (around 08/22/2024).   Selinda Fleeta Finger, MD

## 2024-05-22 NOTE — Assessment & Plan Note (Signed)
 He has mild recurrent major depression.

## 2024-05-23 LAB — HEMOGLOBIN A1C
Est. average glucose Bld gHb Est-mCnc: 128 mg/dL
Hgb A1c MFr Bld: 6.1 % — ABNORMAL HIGH (ref 4.8–5.6)

## 2024-06-06 ENCOUNTER — Encounter: Payer: Self-pay | Admitting: Internal Medicine

## 2024-06-06 ENCOUNTER — Ambulatory Visit: Payer: MEDICAID | Admitting: Internal Medicine

## 2024-06-06 VITALS — BP 134/96 | HR 64 | Temp 97.5°F | Resp 16 | Ht 70.0 in | Wt 270.4 lb

## 2024-06-06 DIAGNOSIS — K219 Gastro-esophageal reflux disease without esophagitis: Secondary | ICD-10-CM | POA: Diagnosis not present

## 2024-06-06 MED ORDER — PANTOPRAZOLE SODIUM 40 MG PO TBEC: 40.0000 mg | DELAYED_RELEASE_TABLET | Freq: Every day | ORAL | 5 refills | Status: AC

## 2024-06-06 NOTE — Progress Notes (Signed)
 Office Visit  Subjective   Patient ID: Leroy Lindsey   DOB: 30-Aug-1985   Age: 38 y.o.   MRN: [id number]   Chief Complaint Chief Complaint  Patient presents with   Heartburn    Pt in today for acid reflux. The patient reports around a week ago he started having acid reflux and sulfur burps.      History of Present Illness Mr. Dray is a 38 yo male who comes in today with complaints of reflux.  He states he has had some problems with reflux for years and takes OTC pepcid as needed.  However, he has noted that over the last week his reflux has worsened where he has burping and burning.  There is some nausea but no vomiting or abdominal pain.  There is no BRBPR or melena.  He states that orange juice and barbecue makes his reflux symptoms worse.     Past Medical History Past Medical History:  Diagnosis Date   Asthma    seasonal   Depression    OSA (obstructive sleep apnea)      Allergies No Known Allergies   Medications  Current Outpatient Medications:    escitalopram  (LEXAPRO ) 20 MG tablet, Take 1 tablet (20 mg total) by mouth daily., Disp: 90 tablet, Rfl: 3   fluticasone -salmeterol (ADVAIR HFA) 115-21 MCG/ACT inhaler, Inhale 2 puffs into the lungs 2 (two) times daily., Disp: 1 each, Rfl: 12   hydrOXYzine  (VISTARIL ) 25 MG capsule, Take 1 capsule (25 mg total) by mouth daily as needed for anxiety (panic attacks)., Disp: 30 capsule, Rfl: 1   VENTOLIN  HFA 108 (90 Base) MCG/ACT inhaler, Inhale 2 puffs into the lungs every 4 (four) hours as needed for wheezing or shortness of breath., Disp: 1 each, Rfl: 2   Review of Systems Review of Systems  Constitutional:  Negative for chills and fever.  Respiratory:  Negative for cough and shortness of breath.   Cardiovascular:  Negative for chest pain and palpitations.  Gastrointestinal:  Positive for heartburn and nausea. Negative for abdominal pain, blood in stool, constipation, diarrhea and vomiting.  Neurological:  Negative for  dizziness, weakness and headaches.       Objective:    Vitals BP (!) 134/96   Pulse 64   Temp (!) 97.5 F (36.4 C) (Temporal)   Resp 16   Ht 5' 10 (1.778 m)   Wt 270 lb 6.4 oz (122.7 kg)   SpO2 96%   BMI 38.80 kg/m    Physical Examination Physical Exam Constitutional:      Appearance: Normal appearance. He is not ill-appearing.  Cardiovascular:     Rate and Rhythm: Normal rate and regular rhythm.     Pulses: Normal pulses.     Heart sounds: No murmur heard.    No friction rub. No gallop.  Pulmonary:     Effort: Pulmonary effort is normal. No respiratory distress.     Breath sounds: No wheezing, rhonchi or rales.  Abdominal:     General: Abdomen is flat. Bowel sounds are normal. There is no distension.     Palpations: Abdomen is soft.     Tenderness: There is no abdominal tenderness.  Musculoskeletal:     Right lower leg: No edema.     Left lower leg: No edema.  Skin:    General: Skin is warm and dry.     Findings: No rash.  Neurological:     Mental Status: He is alert.  Assessment & Plan:   Gastroesophageal reflux disease He is having reflux where we will start him on protonix 40mg  daily.    No follow-ups on file.   Selinda Fleeta Finger, MD

## 2024-06-06 NOTE — Assessment & Plan Note (Signed)
 He is having reflux where we will start him on protonix 40mg  daily.

## 2024-06-18 ENCOUNTER — Ambulatory Visit: Payer: Self-pay

## 2024-06-18 NOTE — Progress Notes (Signed)
 Patient called.  Patient aware.  Per Ve  His prediabetes is controlled.

## 2024-07-27 ENCOUNTER — Encounter (INDEPENDENT_AMBULATORY_CARE_PROVIDER_SITE_OTHER): Payer: Self-pay | Admitting: Otolaryngology

## 2024-07-27 ENCOUNTER — Ambulatory Visit (INDEPENDENT_AMBULATORY_CARE_PROVIDER_SITE_OTHER): Payer: MEDICAID | Admitting: Otolaryngology

## 2024-07-27 VITALS — BP 141/91 | HR 96 | Temp 97.7°F | Wt 270.0 lb

## 2024-07-27 DIAGNOSIS — H6122 Impacted cerumen, left ear: Secondary | ICD-10-CM | POA: Diagnosis not present

## 2024-07-27 DIAGNOSIS — H7293 Unspecified perforation of tympanic membrane, bilateral: Secondary | ICD-10-CM | POA: Diagnosis not present

## 2024-07-27 DIAGNOSIS — H906 Mixed conductive and sensorineural hearing loss, bilateral: Secondary | ICD-10-CM | POA: Diagnosis not present

## 2024-07-27 DIAGNOSIS — H7203 Central perforation of tympanic membrane, bilateral: Secondary | ICD-10-CM

## 2024-07-28 NOTE — Progress Notes (Signed)
 Patient ID: Leroy Lindsey, male   DOB: [birth year], 38 y.o.   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  Over the past six months, he has not experienced new otitis media. He denies otorrhea from either ear except for a small amount on the right. He continues to use Ciprodex  ear drops approximately 2  times per week and has refills available.  He describes persistent right-sided aural fullness and difficulty with Eustachian tube dysfunction, characterized by an inability to equalize pressure and a sensation similar to altitude changes. He requests cerumen removal due to perceived wax accumulation.  He has not noted any new symptoms related to hearing loss or vertigo.  He reports recent allergic symptoms, including sneezing and watery eyes over the past week. He uses Flonase nasal spray, two sprays in each nostril daily.  Exam: General: Communicates without difficulty, well nourished, no acute distress. Head: Normocephalic, no evidence injury, no tenderness, facial buttresses intact without stepoff. Face/sinus: No tenderness to palpation and percussion. Facial movement is normal and symmetric. Eyes: PERRL, EOMI. No scleral icterus, conjunctivae clear. Neuro: CN II exam reveals vision grossly intact.  No nystagmus at any point of gaze. Ears: Auricles well formed without lesions.  Left ear cerumen impaction.  After the cerumen removal procedure, bilateral tympanic membrane perforations are noted. Nose: External evaluation reveals normal support and skin without lesions.  Dorsum is intact.  Anterior rhinoscopy reveals congested mucosa over anterior aspect of inferior turbinates and intact septum.  No purulence noted. Oral:  Oral  cavity and oropharynx are intact, symmetric, without erythema or edema.  Mucosa is moist without lesions. Neck: Full range of motion without pain.  There is no significant lymphadenopathy.  No masses palpable.  Thyroid bed within normal limits to palpation.  Parotid glands and submandibular glands equal bilaterally without mass.  Trachea is midline. Neuro:  CN 2-12 grossly intact.   Procedure: Left ear cerumen disimpaction Anesthesia: None Description: Under the operating microscope, the cerumen is carefully removed with a combination of cerumen currette, alligator forceps, and suction catheters.  After the cerumen is removed, a left TM perforation is noted.  No mass, erythema, or lesions. The patient tolerated the procedure well.   Assessment & Plan Chronic right otitis media with otorrhea Chronic right otitis media with prior otorrhea, currently without evidence of active infection, drainage, or polypoid tissue. Condition has improved compared to previous visits. No surgical intervention indicated. - Advised continued use of Ciprodex  otic drops as needed if otorrhea recurs. - Refills for Ciprodex  are available if required. - Instructed to return sooner if new drainage or infection develops. - Scheduled follow-up in six months.  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 Cerumen was present and removed. No surgical intervention required if water precautions are maintained. - Otomicroscopy with left ear cerumen disimpaction. - Advised to avoid submerging head under water to prevent water entry into the ear. - Scheduled follow-up in six months.  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 Sensation of ear popping attributed to nasal congestion and Eustachian tube dysfunction. Tympanic membrane perforations allow for pressure  equalization. - Discussed that ear popping is likely due to nasal congestion  and Eustachian tube dysfunction. - Recommended use of steroid nasal spray (Flonase) for nasal congestion as previously used.

## 2024-08-20 ENCOUNTER — Ambulatory Visit: Payer: MEDICAID | Admitting: Internal Medicine

## 2024-08-21 ENCOUNTER — Ambulatory Visit: Payer: MEDICAID | Admitting: Internal Medicine

## 2024-08-29 ENCOUNTER — Encounter: Payer: Self-pay | Admitting: Internal Medicine

## 2024-08-29 ENCOUNTER — Ambulatory Visit: Payer: MEDICAID | Admitting: Internal Medicine

## 2024-08-29 VITALS — BP 140/100 | HR 100 | Temp 97.6°F | Resp 18 | Ht 68.0 in | Wt 269.5 lb

## 2024-08-29 DIAGNOSIS — R7303 Prediabetes: Secondary | ICD-10-CM | POA: Insufficient documentation

## 2024-08-29 DIAGNOSIS — F3341 Major depressive disorder, recurrent, in partial remission: Secondary | ICD-10-CM

## 2024-08-29 DIAGNOSIS — J4520 Mild intermittent asthma, uncomplicated: Secondary | ICD-10-CM | POA: Diagnosis not present

## 2024-08-29 MED ORDER — QUETIAPINE FUMARATE 25 MG PO TABS: 25.0000 mg | ORAL_TABLET | Freq: Every day | ORAL | 3 refills | Status: AC

## 2024-08-29 MED ORDER — VENTOLIN HFA 108 (90 BASE) MCG/ACT IN AERS: 2.0000 | INHALATION_SPRAY | RESPIRATORY_TRACT | 2 refills | Status: AC | PRN

## 2024-08-29 MED ORDER — FLUTICASONE-SALMETEROL 115-21 MCG/ACT IN AERO: 2.0000 | INHALATION_SPRAY | Freq: Two times a day (BID) | RESPIRATORY_TRACT | 12 refills | Status: AC

## 2024-08-29 MED ORDER — ESCITALOPRAM OXALATE 20 MG PO TABS: 20.0000 mg | ORAL_TABLET | Freq: Every day | ORAL | 3 refills | Status: AC

## 2024-08-29 NOTE — Assessment & Plan Note (Signed)
"    He has borderline diabetes and I have suggested to cut down portion of his meal.  I will do physical exam on next visit and will repeat labs. "

## 2024-08-29 NOTE — Assessment & Plan Note (Signed)
"    He will continue taking escitalopram  20 mg daily and I will send refill for that.  I will also add Seroquel  25 mg at bedtime as adjunct therapy. "

## 2024-08-29 NOTE — Assessment & Plan Note (Signed)
"    He need refill for Advair and albuterol .  I have suggested to take Advair 2 puffs twice a day every day and Ventolin  inhaler only as needed. "

## 2024-08-29 NOTE — Assessment & Plan Note (Signed)
"    His BMI is 40.  He need to cut down portion of his meal.  His depression need to be corrected 1st. "

## 2024-08-29 NOTE — Progress Notes (Signed)
 "  Office Visit  Subjective   Patient ID: Leroy Lindsey   DOB: 1986-07-04   Age: 39 y.o.   MRN: [id number]   Chief Complaint Chief Complaint  Patient presents with   Follow-up    3 month     History of Present Illness    39 years old male who is here for follow-up with his mother.  Per wife he is depressed he does not leave his room.  He does not sleep and he has keep coming to her room and asking if mama you are okay.  He denies having any suicidal ideation.  He says that he take escitalopram  20 mg and that is helping but he is not completely better.    His blood pressure is 140/100 and he do not take any medication for  Hypertension.  He says that his blood pressure has been good.    He also has a history of asthma and he take Advair and albuterol  but he takes both inhaler alternate days.  I have told him to take Advair Diskus 2 puff twice a day and albuterol  as needed.    He also has borderline diabetes and his last hemoglobin A1c was 6.1 year ago.  He says that he is not aware of that and is not watching his diet.  He is a morbidly obese male whose weight is 269 lb with BMI of 40.    Past Medical History Past Medical History:  Diagnosis Date   Asthma    seasonal   Depression    OSA (obstructive sleep apnea)      Allergies Allergies[1]   Review of Systems Review of Systems  Constitutional: Negative.   Respiratory: Negative.    Cardiovascular: Negative.   Psychiatric/Behavioral:  Positive for depression. The patient has insomnia.        Objective:    Vitals BP (!) 140/100   Pulse 100   Temp 97.6 F (36.4 C)   Resp 18   Ht 5' 8 (1.727 m)   Wt 269 lb 8 oz (122.2 kg)   SpO2 97%   BMI 40.98 kg/m    Physical Examination Physical Exam Constitutional:      Appearance: Normal appearance. He is obese.  Cardiovascular:     Rate and Rhythm: Normal rate and regular rhythm.     Heart sounds: Normal heart sounds.  Pulmonary:     Effort: Pulmonary effort is  normal.     Breath sounds: Normal breath sounds.  Abdominal:     General: Bowel sounds are normal.     Palpations: Abdomen is soft.  Neurological:     General: No focal deficit present.     Mental Status: He is alert and oriented to person, place, and time.        Assessment & Plan:   Asthma   He need refill for Advair and albuterol .  I have suggested to take Advair 2 puffs twice a day every day and Ventolin  inhaler only as needed.  Borderline diabetes mellitus   He has borderline diabetes and I have suggested to cut down portion of his meal.  I will do physical exam on next visit and will repeat labs.  Morbid obesity ([name])   His BMI is 40.  He need to cut down portion of his meal.  His depression need to be corrected 1st.  Depression   He will continue taking escitalopram  20 mg daily and I will send refill for that.  I will also  add Seroquel  25 mg at bedtime as adjunct therapy.    Return in about 1 month (around 09/26/2024) for For PE .   Roetta Dare, MD      [1] No Known Allergies  "

## 2024-09-26 ENCOUNTER — Encounter: Payer: MEDICAID | Admitting: Internal Medicine

## 2025-02-01 ENCOUNTER — Ambulatory Visit (INDEPENDENT_AMBULATORY_CARE_PROVIDER_SITE_OTHER): Payer: MEDICAID | Admitting: Otolaryngology
